# Patient Record
Sex: Male | Born: 1980 | Race: White | Hispanic: No | Marital: Married | Smoking: Former smoker
Health system: Southern US, Community
[De-identification: ages and names within clinical notes are randomized; demographics above are authoritative.]

---
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
## 2002-03-24 ENCOUNTER — Emergency Department (HOSPITAL_COMMUNITY): Admission: EM | Admit: 2002-03-24 | Discharge: 2002-03-24 | Payer: Self-pay | Admitting: Internal Medicine

## 2019-08-27 DIAGNOSIS — H10013 Acute follicular conjunctivitis, bilateral: Secondary | ICD-10-CM | POA: Diagnosis not present

## 2019-12-15 ENCOUNTER — Emergency Department (HOSPITAL_BASED_OUTPATIENT_CLINIC_OR_DEPARTMENT_OTHER)
Admission: EM | Admit: 2019-12-15 | Discharge: 2019-12-15 | Disposition: A | Discharge status: Home / Self Care | Payer: BC Managed Care – PPO | Source: Home / Self Care | Attending: Emergency Medicine | Admitting: Emergency Medicine

## 2019-12-15 ENCOUNTER — Emergency Department (HOSPITAL_BASED_OUTPATIENT_CLINIC_OR_DEPARTMENT_OTHER): Payer: BC Managed Care – PPO

## 2019-12-15 ENCOUNTER — Encounter (HOSPITAL_COMMUNITY): Payer: Self-pay | Admitting: Emergency Medicine

## 2019-12-15 ENCOUNTER — Encounter (HOSPITAL_BASED_OUTPATIENT_CLINIC_OR_DEPARTMENT_OTHER): Payer: Self-pay | Admitting: Emergency Medicine

## 2019-12-15 ENCOUNTER — Other Ambulatory Visit: Payer: Self-pay

## 2019-12-15 ENCOUNTER — Inpatient Hospital Stay (HOSPITAL_COMMUNITY)
Admission: EM | Admit: 2019-12-15 | Discharge: 2019-12-20 | DRG: 177 | Disposition: A | Discharge status: Home / Self Care | Payer: BC Managed Care – PPO | Attending: Internal Medicine | Admitting: Internal Medicine

## 2019-12-15 DIAGNOSIS — Z79899 Other long term (current) drug therapy: Secondary | ICD-10-CM | POA: Diagnosis not present

## 2019-12-15 DIAGNOSIS — U071 COVID-19: Secondary | ICD-10-CM

## 2019-12-15 DIAGNOSIS — R0682 Tachypnea, not elsewhere classified: Secondary | ICD-10-CM | POA: Diagnosis not present

## 2019-12-15 DIAGNOSIS — Z6832 Body mass index (BMI) 32.0-32.9, adult: Secondary | ICD-10-CM

## 2019-12-15 DIAGNOSIS — R0602 Shortness of breath: Secondary | ICD-10-CM | POA: Diagnosis not present

## 2019-12-15 DIAGNOSIS — E872 Acidosis, unspecified: Secondary | ICD-10-CM

## 2019-12-15 DIAGNOSIS — Z7982 Long term (current) use of aspirin: Secondary | ICD-10-CM | POA: Diagnosis not present

## 2019-12-15 DIAGNOSIS — E669 Obesity, unspecified: Secondary | ICD-10-CM | POA: Diagnosis not present

## 2019-12-15 DIAGNOSIS — J1282 Pneumonia due to coronavirus disease 2019: Secondary | ICD-10-CM | POA: Diagnosis not present

## 2019-12-15 DIAGNOSIS — Z8249 Family history of ischemic heart disease and other diseases of the circulatory system: Secondary | ICD-10-CM | POA: Diagnosis not present

## 2019-12-15 DIAGNOSIS — J9601 Acute respiratory failure with hypoxia: Secondary | ICD-10-CM | POA: Diagnosis not present

## 2019-12-15 DIAGNOSIS — Z83438 Family history of other disorder of lipoprotein metabolism and other lipidemia: Secondary | ICD-10-CM | POA: Diagnosis not present

## 2019-12-15 DIAGNOSIS — Z87891 Personal history of nicotine dependence: Secondary | ICD-10-CM | POA: Diagnosis not present

## 2019-12-15 DIAGNOSIS — R7401 Elevation of levels of liver transaminase levels: Secondary | ICD-10-CM

## 2019-12-15 DIAGNOSIS — J189 Pneumonia, unspecified organism: Secondary | ICD-10-CM | POA: Diagnosis not present

## 2019-12-15 LAB — FIBRINOGEN: Fibrinogen: 500 mg/dL — ABNORMAL HIGH (ref 210–475)

## 2019-12-15 LAB — COMPREHENSIVE METABOLIC PANEL
ALT: 63 U/L — ABNORMAL HIGH (ref 0–44)
ALT: 68 U/L — ABNORMAL HIGH (ref 0–44)
AST: 52 U/L — ABNORMAL HIGH (ref 15–41)
AST: 55 U/L — ABNORMAL HIGH (ref 15–41)
Albumin: 3.6 g/dL (ref 3.5–5.0)
Albumin: 3.7 g/dL (ref 3.5–5.0)
Alkaline Phosphatase: 56 U/L (ref 38–126)
Alkaline Phosphatase: 60 U/L (ref 38–126)
Anion gap: 10 (ref 5–15)
Anion gap: 11 (ref 5–15)
BUN: 10 mg/dL (ref 6–20)
BUN: 7 mg/dL (ref 6–20)
CO2: 23 mmol/L (ref 22–32)
CO2: 20 mmol/L — ABNORMAL LOW (ref 22–32)
Calcium: 8.2 mg/dL — ABNORMAL LOW (ref 8.9–10.3)
Calcium: 8.6 mg/dL — ABNORMAL LOW (ref 8.9–10.3)
Chloride: 103 mmol/L (ref 98–111)
Chloride: 105 mmol/L (ref 98–111)
Creatinine, Ser: 1.04 mg/dL (ref 0.61–1.24)
Creatinine, Ser: 1.11 mg/dL (ref 0.61–1.24)
GFR calc Af Amer: >60 mL/min (ref >60)
GFR calc Af Amer: >60 mL/min (ref >60)
GFR calc non Af Amer: >60 mL/min (ref >60)
GFR calc non Af Amer: >60 mL/min (ref >60)
Glucose, Bld: 150 mg/dL — ABNORMAL HIGH (ref 70–99)
Glucose, Bld: 135 mg/dL — ABNORMAL HIGH (ref 70–99)
Potassium: 3.8 mmol/L (ref 3.5–5.1)
Potassium: 4.2 mmol/L (ref 3.5–5.1)
Sodium: 136 mmol/L (ref 135–145)
Sodium: 136 mmol/L (ref 135–145)
Total Bilirubin: 0.7 mg/dL (ref 0.3–1.2)
Total Bilirubin: 1.1 mg/dL (ref 0.3–1.2)
Total Protein: 6.3 g/dL — ABNORMAL LOW (ref 6.5–8.1)
Total Protein: 6.8 g/dL (ref 6.5–8.1)

## 2019-12-15 LAB — CBC WITH DIFFERENTIAL/PLATELET
Abs Immature Granulocytes: 0 10*3/uL (ref 0.00–0.07)
Basophils Absolute: 0 10*3/uL (ref 0.0–0.1)
Basophils Relative: 0 %
Eosinophils Absolute: 0 10*3/uL (ref 0.0–0.5)
Eosinophils Relative: 0 %
HCT: 43.6 % (ref 39.0–52.0)
Hemoglobin: 15.2 g/dL (ref 13.0–17.0)
Immature Granulocytes: 0 %
Lymphocytes Relative: 29 %
Lymphs Abs: 0.8 10*3/uL (ref 0.7–4.0)
MCH: 31.7 pg (ref 26.0–34.0)
MCHC: 34.9 g/dL (ref 30.0–36.0)
MCV: 91 fL (ref 80.0–100.0)
Monocytes Absolute: 0.2 10*3/uL (ref 0.1–1.0)
Monocytes Relative: 9 %
Neutro Abs: 1.6 10*3/uL — ABNORMAL LOW (ref 1.7–7.7)
Neutrophils Relative %: 62 %
Platelets: 141 10*3/uL — ABNORMAL LOW (ref 150–400)
RBC: 4.79 MIL/uL (ref 4.22–5.81)
RDW: 12.4 % (ref 11.5–15.5)
WBC: 2.6 10*3/uL — ABNORMAL LOW (ref 4.0–10.5)
nRBC: 0 % (ref 0.0–0.2)

## 2019-12-15 LAB — URINALYSIS, ROUTINE W REFLEX MICROSCOPIC
Bacteria, UA: NONE SEEN
Bilirubin Urine: NEGATIVE
Glucose, UA: NEGATIVE mg/dL
Ketones, ur: NEGATIVE mg/dL
Leukocytes,Ua: NEGATIVE
Nitrite: NEGATIVE
Protein, ur: NEGATIVE mg/dL
Specific Gravity, Urine: 1.005 (ref 1.005–1.030)
pH: 6 (ref 5.0–8.0)

## 2019-12-15 LAB — PROTIME-INR
INR: 1 (ref 0.8–1.2)
Prothrombin Time: 12.8 seconds (ref 11.4–15.2)

## 2019-12-15 LAB — LACTATE DEHYDROGENASE: LDH: 338 U/L — ABNORMAL HIGH (ref 98–192)

## 2019-12-15 LAB — LACTIC ACID, PLASMA
Lactic Acid, Venous: 1.1 mmol/L (ref 0.5–1.9)
Lactic Acid, Venous: 1.3 mmol/L (ref 0.5–1.9)

## 2019-12-15 LAB — D-DIMER, QUANTITATIVE: D-Dimer, Quant: 0.4 ug/mL-FEU (ref 0.00–0.50)

## 2019-12-15 LAB — PROCALCITONIN: Procalcitonin: <0.1 ng/mL

## 2019-12-15 LAB — TRIGLYCERIDES: Triglycerides: 175 mg/dL — ABNORMAL HIGH (ref <150)

## 2019-12-15 LAB — C-REACTIVE PROTEIN: CRP: 4.7 mg/dL — ABNORMAL HIGH (ref <1.0)

## 2019-12-15 LAB — TROPONIN I (HIGH SENSITIVITY): Troponin I (High Sensitivity): 3 ng/L (ref <18)

## 2019-12-15 LAB — FERRITIN: Ferritin: 1474 ng/mL — ABNORMAL HIGH (ref 24–336)

## 2019-12-15 MED ORDER — SODIUM CHLORIDE 0.9 % IV SOLN
INTRAVENOUS | Status: DC | PRN
  Administered 2019-12-15: 250 mL via INTRAVENOUS

## 2019-12-15 MED ORDER — FAMOTIDINE IN NACL 20-0.9 MG/50ML-% IV SOLN: 20.0000 mg | Freq: Once | INTRAVENOUS | Status: DC | PRN

## 2019-12-15 MED ORDER — DIPHENHYDRAMINE HCL 50 MG/ML IJ SOLN: 50.0000 mg | Freq: Once | INTRAMUSCULAR | Status: DC | PRN

## 2019-12-15 MED ORDER — ALBUTEROL SULFATE HFA 108 (90 BASE) MCG/ACT IN AERS: 2.0000 | INHALATION_SPRAY | Freq: Once | RESPIRATORY_TRACT | Status: DC | PRN

## 2019-12-15 MED ORDER — EPINEPHRINE 0.3 MG/0.3ML IJ SOAJ: 0.3000 mg | Freq: Once | INTRAMUSCULAR | Status: DC | PRN

## 2019-12-15 MED ORDER — METHYLPREDNISOLONE SODIUM SUCC 125 MG IJ SOLR: 125.0000 mg | Freq: Once | INTRAMUSCULAR | Status: DC | PRN

## 2019-12-15 MED ORDER — PREDNISONE 20 MG PO TABS: 40.0000 mg | ORAL_TABLET | Freq: Every day | ORAL | 0 refills | Status: DC

## 2019-12-15 MED ORDER — SODIUM CHLORIDE 0.9 % IV SOLN
1200.0000 mg | Freq: Once | INTRAVENOUS | Status: AC
  Administered 2019-12-15: 1200 mg via INTRAVENOUS
  Filled 2019-12-15: qty 10

## 2019-12-15 MED ORDER — ACETAMINOPHEN 500 MG PO TABS
1000.0000 mg | ORAL_TABLET | Freq: Once | ORAL | Status: AC
  Administered 2019-12-15: 1000 mg via ORAL
  Filled 2019-12-15: qty 2

## 2019-12-15 NOTE — ED Triage Notes (Signed)
COVID+ x 1 week. C/o worsening SOB x 2-3 days.

## 2019-12-15 NOTE — ED Notes (Signed)
Pt on monitor 

## 2019-12-15 NOTE — ED Notes (Signed)
I put pt on on 2L of oxygen for comfort care as well as pt respirations were 31

## 2019-12-15 NOTE — ED Notes (Signed)
Pt reports improved s/s of sob after 2 liters nasal cannula. spo2 96% at this time. Other vss on ccm. Side rails up, call bell in reach.

## 2019-12-15 NOTE — ED Provider Notes (Signed)
[name] EMERGENCY DEPARTMENT Provider Note   CSN: [id number] Arrival date & time: 12/15/19  1902     History Chief Complaint  Patient presents with  . Shortness of Breath    Bruce Russell is a 39 y.o. male with no past medical history who presents with shortness of breath and labored breathing in the setting of Covid infection.  Has had 8 days of symptoms.  Was seen in the ED earlier this morning for similar symptoms.  He received prednisone and monoclonal antibody treatment, but was able to ambulate with SPO2 above 91% and was sent home with pulse ox for monitoring.  Patient has had SPO2 in the mid 80s with ambulating at home and has continued to have worsening labored breathing.  Respiratory rate estimated to be 35-40 at home by wife.  Return for labored breathing, was placed on 2 L nasal cannula in triage for comfort and had improvement in tachycardia from 115 to 80s.  Denies chest pain, palpitations, nausea, vomiting, leg pain or swelling.   Shortness of Breath Severity:  Moderate Onset quality:  Gradual Timing:  Constant Progression:  Worsening Relieved by:  Oxygen Worsened by:  Activity, deep breathing and coughing Ineffective treatments:  Rest Associated symptoms: cough and fever   Associated symptoms: no abdominal pain, no chest pain, no ear pain, no rash, no sore throat, no vomiting and no wheezing        History reviewed. No pertinent past medical history.  Patient Active Problem List   Diagnosis Date Noted  . Pneumonia 12/15/2019    History reviewed. No pertinent surgical history.     No family history on file.  Social History   Tobacco Use  . Smoking status: Former Games developer  . Smokeless tobacco: Never Used  Substance Use Topics  . Alcohol use: Yes  . Drug use: Never    Home Medications Prior to Admission medications   Medication Sig Start Date End Date Taking? Authorizing Provider  acetaminophen (TYLENOL) 500 MG tablet Take 1,000  mg by mouth every 6 (six) hours as needed for moderate pain.   Yes [provider]  Ascorbic Acid (VITAMIN C) 1000 MG tablet Take 1,000 mg by mouth daily.   Yes [provider]  aspirin EC 81 MG tablet Take 81 mg by mouth daily. Swallow whole.   Yes [provider]  cholecalciferol (VITAMIN D3) 25 MCG (1000 UNIT) tablet Take 1,000 Units by mouth daily.   Yes [provider]  dextromethorphan-guaiFENesin (MUCINEX DM) 30-600 MG 12hr tablet Take 1 tablet by mouth 2 (two) times daily.   Yes [provider]  predniSONE (DELTASONE) 20 MG tablet Take 2 tablets (40 mg total) by mouth daily for 5 days. 12/15/19 12/20/19 Yes BeroElmer Sow, MD    Allergies    Other  Review of Systems   Review of Systems  Constitutional: Positive for fever. Negative for chills.  HENT: Negative for ear pain and sore throat.   Eyes: Negative for pain and visual disturbance.  Respiratory: Positive for cough and shortness of breath. Negative for wheezing.   Cardiovascular: Negative for chest pain and palpitations.  Gastrointestinal: Negative for abdominal pain and vomiting.  Genitourinary: Negative for dysuria and hematuria.  Musculoskeletal: Negative for arthralgias and back pain.  Skin: Negative for color change and rash.  Neurological: Negative for seizures and syncope.  All other systems reviewed and are negative.   Physical Exam Updated Vital Signs BP 118/66 (BP Location: Right Arm)  Pulse 80   Temp 98 F (36.7 C) (Oral)   Resp 18   Ht 6\' 3"  (1.905 m)   Wt 117 kg   SpO2 96%   BMI 32.24 kg/m   Physical Exam Vitals and nursing note reviewed.  Constitutional:      Appearance: He is well-developed.  HENT:     Head: Normocephalic and atraumatic.  Eyes:     Extraocular Movements: Extraocular movements intact.     Conjunctiva/sclera: Conjunctivae normal.  Cardiovascular:     Rate and Rhythm: Normal rate and regular rhythm.     Heart sounds: No murmur  heard.   Pulmonary:     Effort: Tachypnea present.     Comments: On 2L [place], still with labored breathing and speaking in short sentences. Scattered crackles and rhonchi. Abdominal:     Palpations: Abdomen is soft.     Tenderness: There is no abdominal tenderness.  Musculoskeletal:     Cervical back: Neck supple.     Right lower leg: No tenderness. No edema.     Left lower leg: No tenderness. No edema.  Skin:    General: Skin is warm and dry.     Capillary Refill: Capillary refill takes less than 2 seconds.  Neurological:     General: No focal deficit present.     Mental Status: He is alert.     ED Results / Procedures / Treatments   Labs (all labs ordered are listed, but only abnormal results are displayed) Labs Reviewed  COMPREHENSIVE METABOLIC PANEL - Abnormal; Notable for the following components:      Result Value   CO2 20 (*)    Glucose, Bld 135 (*)    Calcium 8.6 (*)    AST 55 (*)    ALT 68 (*)    All other components within normal limits  URINALYSIS, ROUTINE W REFLEX MICROSCOPIC - Abnormal; Notable for the following components:   Color, Urine STRAW (*)    Hgb urine dipstick SMALL (*)    All other components within normal limits  CULTURE, BLOOD (ROUTINE X 2)  CULTURE, BLOOD (ROUTINE X 2)  RESPIRATORY PANEL BY RT PCR (FLU A&B, COVID)  LACTIC ACID, PLASMA  PROTIME-INR  LACTIC ACID, PLASMA  CBC WITH DIFFERENTIAL/PLATELET    EKG EKG Interpretation  Date/Time:  Thursday December 15 2019 20:01:19 EDT Ventricular Rate:  99 PR Interval:  154 QRS Duration: 84 QT Interval:  350 QTC Calculation: 449 R Axis:   16 Text Interpretation: Normal sinus rhythm Cannot rule out Anterior infarct , age undetermined Abnormal ECG When compared with ECG of EARLIER SAME DATE QT has shortened Confirmed by [id number] (Dione Booze) on 12/15/2019 11:55:32 PM   Radiology DG Chest Port 1 View  Result Date: 12/15/2019 CLINICAL DATA:  Shortness of breath, COVID-19 positive EXAM: PORTABLE  CHEST 1 VIEW COMPARISON:  None. FINDINGS: Heart size is upper limits of normal, likely accentuated by AP semi upright exam. Low lung volumes. Mild interstitial opacities bilaterally with a peripheral and basilar distribution, left worse than right. No appreciable pleural fluid collection. No pneumothorax. IMPRESSION: Mild interstitial opacities bilaterally with a peripheral and basilar distribution, left worse than right. Findings suggestive of multifocal viral pneumonia in the setting of known COVID-19 infection. Electronically Signed   By: 12/17/2019 D.O.   On: 12/15/2019 08:19    Procedures Procedures (including critical care time)  Medications Ordered in ED Medications - No data to display  ED Course  I have reviewed the triage vital  signs and the nursing notes.  Pertinent labs & imaging results that were available during my care of the patient were reviewed by me and considered in my medical decision making (see chart for details).    MDM Rules/Calculators/A&P                          Patient presents with symptomatic Covid with significant work of breathing improved by oxygen.  He was seen at the emergency department earlier today and received monoclonal antibodies, but symptoms continue to worsen.  He would benefit from admission for supplemental oxygen, which does improve his tachypnea, tachycardia, and respiratory effort.  He was admitted to hospital service for further management.  This patient was in with Dr. Dalene Seltzer.  Final Clinical Impression(s) / ED Diagnoses Final diagnoses:  Pneumonia due to COVID-19 virus  Tachypnea    Rx / DC Orders ED Discharge Orders    None       Allayne Butcher, MD 12/16/19 Burna Mortimer    Alvira Monday, MD 12/16/19 2309

## 2019-12-15 NOTE — Discharge Instructions (Addendum)
You were evaluated in the Emergency Department and after careful evaluation, we did not find any emergent condition requiring admission or further testing in the hospital.  Your exam/testing today was overall reassuring.  Symptoms seem to be due to COVID-19.  We have provided you with the monoclonal antibody therapy here in the emergency department to help prevent severe illness.  Given the concern for possibly low oxygen numbers at home while sleeping, we recommend taking the prednisone medication as directed.  Please return to the Emergency Department if you experience any worsening of your condition.  Thank you for allowing [place] to be a part of your care.

## 2019-12-15 NOTE — ED Notes (Signed)
No adverse reactions noted within 15 minutes. Will continue to monitor after infusion.

## 2019-12-15 NOTE — ED Notes (Signed)
This RN at bedside during first 15 mins of antibody infusion to monitor for reactions

## 2019-12-15 NOTE — ED Provider Notes (Signed)
[facility] Emergency Department Provider Note MRN:  [id number]  Arrival date & time: 12/15/19     Chief Complaint   Shortness of Breath (COVID+)   History of Present Illness   Bruce Russell is a 39 y.o. year-old male with no pertinent past medical history presenting to the ED with chief complaint of shortness of breath.  7 days of cough, fever, body aches, more recently over the past 24 to 48 hours worsening shortness of breath.  Explained that he was feeling somewhat better prior to this shortness of breath.  Has been diagnosed with COVID-19.  Denies headache or vision change but has been feeling lightheaded here recently.  No abdominal pain, no vomiting or diarrhea.  Symptoms constant, moderate, symptoms worse with deep breathing, worse with ambulation.  Review of Systems  A complete 10 system review of systems was obtained and all systems are negative except as noted in the HPI and PMH.   Patient's Health History   History reviewed. No pertinent past medical history.  History reviewed. No pertinent surgical history.  No family history on file.  Social History   Socioeconomic History  . Marital status: Married    Spouse name: Not on file  . Number of children: Not on file  . Years of education: Not on file  . Highest education level: Not on file  Occupational History  . Not on file  Tobacco Use  . Smoking status: Former Games developer  . Smokeless tobacco: Never Used  Substance and Sexual Activity  . Alcohol use: Yes  . Drug use: Never  . Sexual activity: Not on file  Other Topics Concern  . Not on file  Social History Narrative  . Not on file   Social Determinants of Health   Financial Resource Strain:   . Difficulty of Paying Living Expenses: Not on file  Food Insecurity:   . Worried About Programme researcher, broadcasting/film/video in the Last Year: Not on file  . Ran Out of Food in the Last Year: Not on file  Transportation Needs:   . Lack of Transportation  (Medical): Not on file  . Lack of Transportation (Non-Medical): Not on file  Physical Activity:   . Days of Exercise per Week: Not on file  . Minutes of Exercise per Session: Not on file  Stress:   . Feeling of Stress : Not on file  Social Connections:   . Frequency of Communication with Friends and Family: Not on file  . Frequency of Social Gatherings with Friends and Family: Not on file  . Attends Religious Services: Not on file  . Active Member of Clubs or Organizations: Not on file  . Attends Banker Meetings: Not on file  . Marital Status: Not on file  Intimate Partner Violence:   . Fear of Current or Ex-Partner: Not on file  . Emotionally Abused: Not on file  . Physically Abused: Not on file  . Sexually Abused: Not on file     Physical Exam   Vitals:   12/15/19 1222 12/15/19 1226  BP:    Pulse: 94 94  Resp:    Temp:    SpO2: 95%     CONSTITUTIONAL: Well-appearing, NAD NEURO:  Alert and oriented x 3, no focal deficits EYES:  eyes equal and reactive ENT/NECK:  no LAD, no JVD CARDIO: Tachycardic rate, well-perfused, normal S1 and S2 PULM: Mildly tachypneic, especially with ambulation GI/GU:  normal bowel sounds, non-distended, non-tender MSK/SPINE:  No  gross deformities, no edema SKIN:  no rash, atraumatic PSYCH:  Appropriate speech and behavior  *Additional and/or pertinent findings included in MDM below  Diagnostic and Interventional Summary    EKG Interpretation  Date/Time:  Thursday December 15 2019 07:30:27 EDT Ventricular Rate:  115 PR Interval:    QRS Duration: 84 QT Interval:  349 QTC Calculation: 483 R Axis:   41 Text Interpretation: Sinus tachycardia Borderline T wave abnormalities Borderline prolonged QT interval Baseline wander in lead(s) II aVR aVF V1 Confirmed by Kennis Carina [phone]) on 12/15/2019 9:23:10 AM      Labs Reviewed  CBC WITH DIFFERENTIAL/PLATELET - Abnormal; Notable for the following components:      Result Value     WBC 2.6 (*)    Platelets 141 (*)    Neutro Abs 1.6 (*)    All other components within normal limits  LACTATE DEHYDROGENASE - Abnormal; Notable for the following components:   LDH 338 (*)    All other components within normal limits  FERRITIN - Abnormal; Notable for the following components:   Ferritin 1,474 (*)    All other components within normal limits  TRIGLYCERIDES - Abnormal; Notable for the following components:   Triglycerides 175 (*)    All other components within normal limits  C-REACTIVE PROTEIN - Abnormal; Notable for the following components:   CRP 4.7 (*)    All other components within normal limits  COMPREHENSIVE METABOLIC PANEL - Abnormal; Notable for the following components:   Glucose, Bld 150 (*)    Calcium 8.2 (*)    Total Protein 6.3 (*)    AST 52 (*)    ALT 63 (*)    All other components within normal limits  CULTURE, BLOOD (ROUTINE X 2)  CULTURE, BLOOD (ROUTINE X 2)  LACTIC ACID, PLASMA  D-DIMER, QUANTITATIVE (NOT AT [facility])  PROCALCITONIN  FIBRINOGEN  TROPONIN I (HIGH SENSITIVITY)    DG Chest Port 1 View  Final Result      Medications  0.9 %  sodium chloride infusion (0 mLs Intravenous Stopped 12/15/19 1131)  diphenhydrAMINE (BENADRYL) injection 50 mg (has no administration in time range)  famotidine (PEPCID) IVPB 20 mg premix (has no administration in time range)  methylPREDNISolone sodium succinate (SOLU-MEDROL) 125 mg/2 mL injection 125 mg (has no administration in time range)  albuterol (VENTOLIN HFA) 108 (90 Base) MCG/ACT inhaler 2 puff (has no administration in time range)  EPINEPHrine (EPI-PEN) injection 0.3 mg (has no administration in time range)  casirivimab-imdevimab (REGEN-COV) 1,200 mg in sodium chloride 0.9 % 110 mL IVPB (0 mg Intravenous Stopped 12/15/19 1131)  acetaminophen (TYLENOL) tablet 1,000 mg (1,000 mg Oral Given 12/15/19 1130)     Procedures  /  Critical Care Procedures  ED Course and Medical Decision Making  I have  reviewed the triage vital signs, the nursing notes, and pertinent available records from the EMR.  Listed above are laboratory and imaging tests that I personally ordered, reviewed, and interpreted and then considered in my medical decision making (see below for details).  Suspect COVID-19 as the explanation of patient's progression to shortness of breath., also considering pulmonary embolism.  Awaiting labs, chest x-ray.     D-dimer is negative, tachycardia resolved, less concern for PE.  X-ray with evidence of Covid pneumonia.  Patient ambulated without desaturation below 91%.  Good candidate for monoclonal antibody therapy which patient agrees to receiving here in the emergency department.  Patient's wife monitoring his oxygen saturations at home while sleeping, did  note some saturations between 86 and 90% overnight.  Sounds like has a chronic snoring issue and so this could be due to some component of sleep apnea.  No hypoxia here, but will provide burst prednisone.  Appropriate for discharge.  Bruce Sow. Pilar Plate, MD [facility] Emergency Medicine [facility] [email or web address]  Final Clinical Impressions(s) / ED Diagnoses     ICD-10-CM   1. COVID-19  U07.1     ED Discharge Orders         Ordered    predniSONE (DELTASONE) 20 MG tablet  Daily        12/15/19 1239           Discharge Instructions Discussed with and Provided to Patient:     Discharge Instructions     You were evaluated in the Emergency Department and after careful evaluation, we did not find any emergent condition requiring admission or further testing in the hospital.  Your exam/testing today was overall reassuring.  Symptoms seem to be due to COVID-19.  We have provided you with the monoclonal antibody therapy here in the emergency department to help prevent severe illness.  Given the concern for possibly low oxygen numbers at home while sleeping, we recommend taking the prednisone medication as  directed.  Please return to the Emergency Department if you experience any worsening of your condition.  Thank you for allowing [place] to be a part of your care.        Sabas Sous, MD 12/15/19 [phone]

## 2019-12-15 NOTE — ED Notes (Signed)
ED Provider at bedside. 

## 2019-12-15 NOTE — ED Triage Notes (Signed)
Covid pt for the past 8 days getting worse with increase SOB and labored breathing.

## 2019-12-16 ENCOUNTER — Encounter (HOSPITAL_COMMUNITY): Payer: Self-pay | Admitting: Internal Medicine

## 2019-12-16 DIAGNOSIS — J1282 Pneumonia due to coronavirus disease 2019: Secondary | ICD-10-CM

## 2019-12-16 DIAGNOSIS — R7401 Elevation of levels of liver transaminase levels: Secondary | ICD-10-CM

## 2019-12-16 DIAGNOSIS — E872 Acidosis, unspecified: Secondary | ICD-10-CM

## 2019-12-16 DIAGNOSIS — U071 COVID-19: Principal | ICD-10-CM

## 2019-12-16 DIAGNOSIS — J9601 Acute respiratory failure with hypoxia: Secondary | ICD-10-CM

## 2019-12-16 LAB — LACTIC ACID, PLASMA
Lactic Acid, Venous: 1.5 mmol/L (ref 0.5–1.9)
Lactic Acid, Venous: 2.8 mmol/L (ref 0.5–1.9)

## 2019-12-16 LAB — RESPIRATORY PANEL BY RT PCR (FLU A&B, COVID)
Influenza A by PCR: NEGATIVE
Influenza B by PCR: NEGATIVE
SARS Coronavirus 2 by RT PCR: POSITIVE — AB

## 2019-12-16 LAB — HIV ANTIBODY (ROUTINE TESTING W REFLEX): HIV Screen 4th Generation wRfx: NONREACTIVE

## 2019-12-16 MED ORDER — ACETAMINOPHEN 325 MG PO TABS
650.0000 mg | ORAL_TABLET | Freq: Four times a day (QID) | ORAL | Status: DC | PRN
  Filled 2019-12-16: qty 2

## 2019-12-16 MED ORDER — SODIUM CHLORIDE 0.9 % IV BOLUS
1000.0000 mL | Freq: Once | INTRAVENOUS | Status: AC
  Administered 2019-12-16: 1000 mL via INTRAVENOUS

## 2019-12-16 MED ORDER — VITAMIN D 25 MCG (1000 UNIT) PO TABS
1000.0000 [IU] | ORAL_TABLET | Freq: Every day | ORAL | Status: DC
  Administered 2019-12-16 – 2019-12-20 (×5): 1000 [IU] via ORAL
  Filled 2019-12-16 (×5): qty 1

## 2019-12-16 MED ORDER — ZINC SULFATE 220 (50 ZN) MG PO CAPS
220.0000 mg | ORAL_CAPSULE | Freq: Every day | ORAL | Status: DC
  Administered 2019-12-16 – 2019-12-20 (×5): 220 mg via ORAL
  Filled 2019-12-16 (×5): qty 1

## 2019-12-16 MED ORDER — SODIUM CHLORIDE 0.9 % IV SOLN
100.0000 mg | Freq: Every day | INTRAVENOUS | Status: AC
  Administered 2019-12-17 – 2019-12-20 (×4): 100 mg via INTRAVENOUS
  Filled 2019-12-16 (×4): qty 20

## 2019-12-16 MED ORDER — ENOXAPARIN SODIUM 40 MG/0.4ML ~~LOC~~ SOLN
40.0000 mg | Freq: Every day | SUBCUTANEOUS | Status: DC
  Administered 2019-12-17 – 2019-12-20 (×4): 40 mg via SUBCUTANEOUS
  Filled 2019-12-16 (×5): qty 0.4

## 2019-12-16 MED ORDER — ASCORBIC ACID 500 MG PO TABS
500.0000 mg | ORAL_TABLET | Freq: Every day | ORAL | Status: DC
  Administered 2019-12-16 – 2019-12-20 (×5): 500 mg via ORAL
  Filled 2019-12-16 (×5): qty 1

## 2019-12-16 MED ORDER — GUAIFENESIN-DM 100-10 MG/5ML PO SYRP
10.0000 mL | ORAL_SOLUTION | ORAL | Status: DC | PRN
  Administered 2019-12-17: 10 mL via ORAL
  Filled 2019-12-16: qty 10

## 2019-12-16 MED ORDER — METHYLPREDNISOLONE SODIUM SUCC 125 MG IJ SOLR
60.0000 mg | Freq: Two times a day (BID) | INTRAMUSCULAR | Status: DC
  Administered 2019-12-16 – 2019-12-20 (×10): 60 mg via INTRAVENOUS
  Filled 2019-12-16 (×10): qty 2

## 2019-12-16 MED ORDER — SODIUM CHLORIDE 0.9 % IV SOLN
200.0000 mg | Freq: Once | INTRAVENOUS | Status: AC
  Administered 2019-12-16: 200 mg via INTRAVENOUS
  Filled 2019-12-16: qty 40

## 2019-12-16 MED ORDER — HYDROCOD POLST-CPM POLST ER 10-8 MG/5ML PO SUER: 5.0000 mL | Freq: Two times a day (BID) | ORAL | Status: DC | PRN

## 2019-12-16 MED ORDER — ALBUTEROL SULFATE HFA 108 (90 BASE) MCG/ACT IN AERS
2.0000 | INHALATION_SPRAY | Freq: Four times a day (QID) | RESPIRATORY_TRACT | Status: DC | PRN
  Administered 2019-12-17: 2 via RESPIRATORY_TRACT

## 2019-12-16 NOTE — ED Notes (Signed)
Wife constantly asking about what medications and doses pt is getting, wife and pt oriented multiple times about what medication he is getting and that we can only give what is scheduled and ordered by the provider, pt verbalized understanding. Meal given to the pt.

## 2019-12-16 NOTE — ED Notes (Signed)
  Test: lactic acid Critical Value: 2.8  Name of Provider Notified: rathore   Orders Received? Or Actions Taken?: none at this time.

## 2019-12-16 NOTE — H&P (Addendum)
History and Physical    Bruce Russell PRX:[id number] DOB: [birth year] DOA: 12/15/2019  PCP: Patient, No Pcp Per Patient coming from: Home  Chief Complaint: Shortness of breath, Covid positive  HPI: Bruce Russell is a 39 y.o. male with medical history significant of obesity (BMI 32.24) presenting with complaints of shortness of breath.  Patient recently tested positive for COVID-19.  He was seen in the ED yesterday for similar symptoms and received prednisone and monoclonal antibody infusion.  He was able to ambulate but oxygen saturation above 91% at that time and was sent home with pulse ox monitoring.  His oxygen saturation dropped to the mid 80s with ambulation at home and he continued to have worsening shortness of breath.  As such, patient returned to the ED.  Patient states him and several other family members tested positive for Covid about 8 days ago after a family gathering.  His symptoms include fevers, chills, body aches, fatigue, shortness of breath, poor appetite, and loss of taste/smell.  His shortness of breath has continued to worsen.  Denies nausea, vomiting, abdominal pain, or diarrhea.  ED Course: Afebrile. Placed on 2 L supplemental oxygen for tachypnea/increased work of breathing.  SARS-CoV-2 PCR test positive.  CBC done during ED visit yesterday morning showing WBC 2.6, hemoglobin 15.2, hematocrit 43.6, platelet 141K.  Repeat CBC done today pending.  Sodium 136, potassium 4.2, chloride 105, bicarb 20, BUN 7, creatinine 1.1, glucose 135, anion gap 11.  AST 55, ALT 68.  Alk phos and T bili normal.  Lactic acid 1.3 >2.8.  INR 1.0.  UA not suggestive of infection.  Blood cultures pending.  Chest x-ray showing findings concerning for multifocal pneumonia. No medications administered.  Review of Systems:  All systems reviewed and apart from history of presenting illness, are negative.  History reviewed. No pertinent past medical history.  History reviewed. No pertinent surgical  history.   reports that he has quit smoking. He has never used smokeless tobacco. He reports current alcohol use. He reports that he does not use drugs.  Allergies  Allergen Reactions  . Other     Vicks formula 44 : face swelling     Family History  Problem Relation Age of Onset  . CAD Father   . Hypertension Father   . Hyperlipidemia Father     Prior to Admission medications   Medication Sig Start Date End Date Taking? Authorizing Provider  acetaminophen (TYLENOL) 500 MG tablet Take 1,000 mg by mouth every 6 (six) hours as needed for moderate pain.   Yes [provider]  Ascorbic Acid (VITAMIN C) 1000 MG tablet Take 1,000 mg by mouth daily.   Yes [provider]  aspirin EC 81 MG tablet Take 81 mg by mouth daily. Swallow whole.   Yes [provider]  cholecalciferol (VITAMIN D3) 25 MCG (1000 UNIT) tablet Take 1,000 Units by mouth daily.   Yes [provider]  dextromethorphan-guaiFENesin (MUCINEX DM) 30-600 MG 12hr tablet Take 1 tablet by mouth 2 (two) times daily.   Yes [provider]  predniSONE (DELTASONE) 20 MG tablet Take 2 tablets (40 mg total) by mouth daily for 5 days. 12/15/19 12/20/19 Yes Maudie Flakes, MD    Physical Exam: Vitals:   12/16/19 0100 12/16/19 0115 12/16/19 0145 12/16/19 0200  BP: 130/80 118/75 119/72 133/82  Pulse: 86 75 70 82  Resp: 20 (!) 24 20 (!) 29  Temp:      TempSrc:  SpO2: 98% 98% 97% 98%  Weight:      Height:        Physical Exam Constitutional:      General: He is not in acute distress.    Appearance: He is ill-appearing.  HENT:     Head: Normocephalic and atraumatic.  Eyes:     Extraocular Movements: Extraocular movements intact.     Conjunctiva/sclera: Conjunctivae normal.  Cardiovascular:     Rate and Rhythm: Normal rate and regular rhythm.     Pulses: Normal pulses.  Pulmonary:     Effort: Pulmonary effort is normal.     Breath sounds: Rales present. No wheezing.      Comments: Satting well on 2 L supplemental oxygen Abdominal:     General: Bowel sounds are normal. There is no distension.     Palpations: Abdomen is soft.     Tenderness: There is no abdominal tenderness.  Musculoskeletal:        General: No swelling or tenderness.     Cervical back: Normal range of motion and neck supple.  Skin:    General: Skin is warm and dry.  Neurological:     General: No focal deficit present.     Mental Status: He is alert and oriented to person, place, and time.     Labs on Admission: I have personally reviewed following labs and imaging studies  CBC: Recent Labs  Lab 12/15/19 0749  WBC 2.6*  NEUTROABS 1.6*  HGB 15.2  HCT 43.6  MCV 91.0  PLT 366*   Basic Metabolic Panel: Recent Labs  Lab 12/15/19 0749 12/15/19 2100  NA 136 136  K 3.8 4.2  CL 103 105  CO2 23 20*  GLUCOSE 150* 135*  BUN 10 7  CREATININE 1.04 1.11  CALCIUM 8.2* 8.6*   GFR: Estimated Creatinine Clearance: 123.2 mL/min (by C-G formula based on SCr of 1.11 mg/dL). Liver Function Tests: Recent Labs  Lab 12/15/19 0749 12/15/19 2100  AST 52* 55*  ALT 63* 68*  ALKPHOS 56 60  BILITOT 0.7 1.1  PROT 6.3* 6.8  ALBUMIN 3.6 3.7   No results for input(s): LIPASE, AMYLASE in the last 168 hours. No results for input(s): AMMONIA in the last 168 hours. Coagulation Profile: Recent Labs  Lab 12/15/19 2100  INR 1.0   Cardiac Enzymes: No results for input(s): CKTOTAL, CKMB, CKMBINDEX, TROPONINI in the last 168 hours. BNP (last 3 results) No results for input(s): PROBNP in the last 8760 hours. HbA1C: No results for input(s): HGBA1C in the last 72 hours. CBG: No results for input(s): GLUCAP in the last 168 hours. Lipid Profile: Recent Labs    12/15/19 0749  TRIG 175*   Thyroid Function Tests: No results for input(s): TSH, T4TOTAL, FREET4, T3FREE, THYROIDAB in the last 72 hours. Anemia Panel: Recent Labs    12/15/19 0751  FERRITIN 1,474*   Urine analysis:     Component Value Date/Time   COLORURINE STRAW (A) 12/15/2019 2011   APPEARANCEUR CLEAR 12/15/2019 2011   LABSPEC 1.005 12/15/2019 2011   PHURINE 6.0 12/15/2019 2011   GLUCOSEU NEGATIVE 12/15/2019 2011   HGBUR SMALL (A) 12/15/2019 2011   [place] NEGATIVE 12/15/2019 2011   [place] 12/15/2019 2011   PROTEINUR NEGATIVE 12/15/2019 2011   NITRITE NEGATIVE 12/15/2019 2011   LEUKOCYTESUR NEGATIVE 12/15/2019 2011    Radiological Exams on Admission: DG Chest Port 1 View  Result Date: 12/15/2019 CLINICAL DATA:  Shortness of breath, COVID-19 positive EXAM: PORTABLE CHEST 1 VIEW COMPARISON:  None. FINDINGS: Heart size is upper limits of normal, likely accentuated by AP semi upright exam. Low lung volumes. Mild interstitial opacities bilaterally with a peripheral and basilar distribution, left worse than right. No appreciable pleural fluid collection. No pneumothorax. IMPRESSION: Mild interstitial opacities bilaterally with a peripheral and basilar distribution, left worse than right. Findings suggestive of multifocal viral pneumonia in the setting of known COVID-19 infection. Electronically Signed   By: Davina Poke D.O.   On: 12/15/2019 08:19    EKG: Independently reviewed.  Sinus tachycardia, no prior tracing for comparison.  Assessment/Plan Principal Problem:   Pneumonia due to COVID-19 virus Active Problems:   Acute hypoxemic respiratory failure (HCC)   Metabolic acidosis   Elevated transaminase level   Lactic acidosis   Acute hypoxemic respiratory failure secondary to COVID-19 viral multifocal pneumonia: SARS-CoV-2 PCR test positive.  Chest x-ray showing findings concerning for multifocal pneumonia.  D-dimer normal and CRP 4.7.  Hypoxic to the 80s with ambulation at home.  Currently satting well on 2 L supplemental oxygen. -Remdesivir -IV Solu-Medrol 0.5 mg/kg every 12 hours -Received monoclonal antibody infusion on 9/30 -Vitamin C, zinc, vitamin D -Antitussives as  needed -Tylenol as needed -Bronchodilator as needed -Daily CBC with differential, CMP, CRP, D-dimer -Airborne and contact precautions -Incentive spirometry, flutter valve -Encourage prone positioning -Continuous pulse ox -Supplemental oxygen as needed to keep oxygen saturation above 90% -Blood culture x2 pending  Mild lactic acidosis: Suspect due to dehydration in the setting of poor oral intake.  No tachycardia, tachypnea, fever, or other signs of sepsis at this time.  Procalcitonin <0.10. -IV fluid bolus, repeat lactic acid level.  Mild normal anion gap metabolic acidosis: Likely related to mild lactic acidosis. -IV fluid hydration, continue to monitor metabolic panel  Mild elevation of transaminases: Likely related to COVID-19 viral infection. -Continue to monitor LFTs  HIV screening The patient falls between the ages of 13-64 and should be screened for HIV, therefore HIV testing ordered.  DVT prophylaxis: Lovenox Code Status: Full code Family Communication: No family available at this time. Disposition Plan: Status is: Inpatient  Remains inpatient appropriate because:IV treatments appropriate due to intensity of illness or inability to take PO, Inpatient level of care appropriate due to severity of illness and New supplemental oxygen requirement   Dispo: The patient is from: Home              Anticipated d/c is to: Home              Anticipated d/c date is: 3 days              Patient currently is not medically stable to d/c.  The medical decision making on this patient was of high complexity and the patient is at high risk for clinical deterioration, therefore this is a level 3 visit.  Shela Leff MD [facility]  If 7PM-7AM, please contact night-coverage [email or web address]  12/16/2019, 2:12 AM

## 2019-12-16 NOTE — ED Notes (Signed)
bfast ordered 

## 2019-12-16 NOTE — Progress Notes (Signed)
PROGRESS NOTE  Bruce Russell XTG:[id number] DOB: 1981/02/12 DOA: 12/15/2019 PCP: Patient, No Pcp Per   LOS: 1 day   Brief Narrative / Interim history: 39 year old male with history of obesity, came into the hospital with shortness of breath.  He recently tested positive for COVID-19, came initially to the ED and received prednisone and a monoclonal antibody infusion.  He went home then continued to feel short of breath, oxygen levels were in the 80s with ambulation and came back to the hospital.  Chest x-ray on admission shows multifocal pneumonia.  He was placed on remdesivir and steroids and admitted to the hospital  Subjective / 24h Interval events: He is doing well this morning, his breathing is good at rest, gets weak and fatigued with walking.  Assessment & Plan:  Principal Problem Acute Hypoxic Respiratory Failure due to Covid-19 Viral Illness /multifocal pneumonia -Minimal oxygen needs this morning, on 2 L -He was placed on remdesivir, continue -Continue steroids -Continue to monitor inflammatory markers -Lactic acidosis resolved   COVID-19 Labs  Recent Labs    12/15/19 0749 12/15/19 0751  DDIMER 0.40  --   FERRITIN  --  1,474*  LDH 338*  --   CRP  --  4.7*    Lab Results  Component Value Date   SARSCOV2NAA POSITIVE (A) 12/15/2019    Active Problems Elevated LFTs-in the setting of Covid infection, stable today  Scheduled Meds: . vitamin C  500 mg Oral Daily  . cholecalciferol  1,000 Units Oral Daily  . enoxaparin (LOVENOX) injection  40 mg Subcutaneous Q0600  . methylPREDNISolone (SOLU-MEDROL) injection  60 mg Intravenous Q12H  . zinc sulfate  220 mg Oral Daily   Continuous Infusions: . [START ON 12/17/2019] remdesivir 100 mg in NS 100 mL     PRN Meds:.acetaminophen, albuterol, chlorpheniramine-HYDROcodone, guaiFENesin-dextromethorphan  DVT prophylaxis: Lovenox Code Status: Full code Family Communication: wife over the phone  Status is:  Inpatient  Remains inpatient appropriate because:Inpatient level of care appropriate due to severity of illness  Dispo: The patient is from: Home              Anticipated d/c is to: Home              Anticipated d/c date is: 2 days              Patient currently is not medically stable to d/c.  Consultants:  None   Procedures:  None   Microbiology: None   Antibacterials: None    Objective: Vitals:   12/16/19 0600 12/16/19 0700 12/16/19 0900 12/16/19 1000  BP: 121/74 123/76 121/85 (!) 146/86  Pulse: 70 69 72 86  Resp: 16 15 13 19   Temp:      TempSrc:      SpO2: 98% 96% 97% 94%  Weight:      Height:        Intake/Output Summary (Last 24 hours) at 12/16/2019 1025 Last data filed at 12/16/2019 0532 Gross per 24 hour  Intake 2000 ml  Output --  Net 2000 ml   Filed Weights   12/15/19 2009  Weight: 117 kg    Examination: Constitutional: NAD Eyes: no scleral icterus ENMT: Mucous membranes are moist.  Neck: normal, supple Respiratory: Faint bibasilar rhonchi, no wheezing, no crackles Cardiovascular: Regular rate and rhythm, no murmurs / rubs / gallops. Abdomen: non distended, no tenderness. Bowel sounds positive.  Musculoskeletal: no clubbing / cyanosis.  Skin: no rashes Neurologic: CN 2-12 grossly intact. Strength 5/5 in  all 4.   Data Reviewed: I have independently reviewed following labs and imaging studies   CBC: Recent Labs  Lab 12/15/19 0749  WBC 2.6*  NEUTROABS 1.6*  HGB 15.2  HCT 43.6  MCV 91.0  PLT 141*   Basic Metabolic Panel: Recent Labs  Lab 12/15/19 0749 12/15/19 2100  NA 136 136  K 3.8 4.2  CL 103 105  CO2 23 20*  GLUCOSE 150* 135*  BUN 10 7  CREATININE 1.04 1.11  CALCIUM 8.2* 8.6*   GFR: Estimated Creatinine Clearance: 123.2 mL/min (by C-G formula based on SCr of 1.11 mg/dL). Liver Function Tests: Recent Labs  Lab 12/15/19 0749 12/15/19 2100  AST 52* 55*  ALT 63* 68*  ALKPHOS 56 60  BILITOT 0.7 1.1  PROT 6.3* 6.8   ALBUMIN 3.6 3.7   No results for input(s): LIPASE, AMYLASE in the last 168 hours. No results for input(s): AMMONIA in the last 168 hours. Coagulation Profile: Recent Labs  Lab 12/15/19 2100  INR 1.0   Cardiac Enzymes: No results for input(s): CKTOTAL, CKMB, CKMBINDEX, TROPONINI in the last 168 hours. BNP (last 3 results) No results for input(s): PROBNP in the last 8760 hours. HbA1C: No results for input(s): HGBA1C in the last 72 hours. CBG: No results for input(s): GLUCAP in the last 168 hours. Lipid Profile: Recent Labs    12/15/19 0749  TRIG 175*   Thyroid Function Tests: No results for input(s): TSH, T4TOTAL, FREET4, T3FREE, THYROIDAB in the last 72 hours. Anemia Panel: Recent Labs    12/15/19 0751  FERRITIN 1,474*   Urine analysis:    Component Value Date/Time   COLORURINE STRAW (A) 12/15/2019 2011   APPEARANCEUR CLEAR 12/15/2019 2011   LABSPEC 1.005 12/15/2019 2011   PHURINE 6.0 12/15/2019 2011   GLUCOSEU NEGATIVE 12/15/2019 2011   HGBUR SMALL (A) 12/15/2019 2011   BILIRUBINUR NEGATIVE 12/15/2019 2011   KETONESUR NEGATIVE 12/15/2019 2011   PROTEINUR NEGATIVE 12/15/2019 2011   NITRITE NEGATIVE 12/15/2019 2011   LEUKOCYTESUR NEGATIVE 12/15/2019 2011   Sepsis Labs: Invalid input(s): PROCALCITONIN, LACTICIDVEN  Recent Results (from the past 240 hour(s))  Blood Culture (routine x 2)     Status: None (Preliminary result)   Collection Time: 12/15/19  7:45 AM   Specimen: BLOOD  Result Value Ref Range Status   Specimen Description   Final    BLOOD LEFT ANTECUBITAL Performed at [facility], [address], [place], [place] [address]    Special Requests   Final    BOTTLES DRAWN AEROBIC AND ANAEROBIC Blood Culture adequate volume Performed at [facility], [address] [place], [place] [address]    Culture   Final    NO GROWTH < 24 HOURS Performed at [facility], [address] [place], [place] [address]    Report Status  PENDING  Incomplete  Blood Culture (routine x 2)     Status: None (Preliminary result)   Collection Time: 12/15/19  7:54 AM   Specimen: BLOOD  Result Value Ref Range Status   Specimen Description   Final    BLOOD RIGHT ANTECUBITAL Performed at [facility], [address], [place], [place] [address]    Special Requests   Final    BOTTLES DRAWN AEROBIC AND ANAEROBIC Blood Culture adequate volume Performed at [facility], [address] [place], [place] [address]    Culture   Final    NO GROWTH < 24 HOURS  Performed at [facility], [address] [place], [place] [address]    Report Status PENDING  Incomplete  Culture, blood (Routine x 2)     Status: None (Preliminary result)   Collection Time: 12/15/19  8:38 PM   Specimen: BLOOD  Result Value Ref Range Status   Specimen Description BLOOD RIGHT ARM  Final   Special Requests   Final    BOTTLES DRAWN AEROBIC AND ANAEROBIC Blood Culture adequate volume   Culture   Final    NO GROWTH < 12 HOURS Performed at [facility], [address] [place], [place] [address]    Report Status PENDING  Incomplete  Culture, blood (Routine x 2)     Status: None (Preliminary result)   Collection Time: 12/15/19  8:44 PM   Specimen: BLOOD LEFT HAND  Result Value Ref Range Status   Specimen Description BLOOD LEFT HAND  Final   Special Requests   Final    BOTTLES DRAWN AEROBIC ONLY Blood Culture results may not be optimal due to an excessive volume of blood received in culture bottles   Culture   Final    NO GROWTH < 12 HOURS Performed at [facility], [address] [place], [place] [address]    Report Status PENDING  Incomplete  Respiratory Panel by RT PCR (Flu A&B, Covid) - Nasopharyngeal Swab     Status: Abnormal   Collection Time: 12/15/19 10:29 PM   Specimen: Nasopharyngeal Swab  Result Value Ref Range Status   SARS Coronavirus 2 by RT PCR POSITIVE (A) NEGATIVE Final    Comment: RESULT CALLED TO, READ  BACK BY AND VERIFIED WITH: B BECK RN 12/16/19 AT 0018 SK (NOTE) SARS-CoV-2 target nucleic acids are DETECTED.  SARS-CoV-2 RNA is generally detectable in upper respiratory specimens  during the acute phase of infection. Positive results are indicative of the presence of the identified virus, but do not rule out bacterial infection or co-infection with other pathogens not detected by the test. Clinical correlation with patient history and other diagnostic information is necessary to determine patient infection status. The expected result is Negative.  Fact Sheet for Patients:  https://www.moore.com/https://www.fda.gov/media/142436/download  Fact Sheet for Healthcare Providers: https://www.young.biz/https://www.fda.gov/media/142435/download  This test is not yet approved or cleared by the [place] FDA and  has been authorized for detection and/or diagnosis of SARS-CoV-2 by FDA under an Emergency Use Authorization (EUA).  This EUA will remain in effect (meaning this test can be used)  for the duration of  the COVID-19 declaration under Section 564(b)(1) of the Act, 21 U.S.C. section 360bbb-3(b)(1), unless the authorization is terminated or revoked sooner.      Influenza A by PCR NEGATIVE NEGATIVE Final   Influenza B by PCR NEGATIVE NEGATIVE Final    Comment: (NOTE) The Xpert Xpress SARS-CoV-2/FLU/RSV assay is intended as an aid in  the diagnosis of influenza from Nasopharyngeal swab specimens and  should not be used as a sole basis for treatment. Nasal washings and  aspirates are unacceptable for Xpert Xpress SARS-CoV-2/FLU/RSV  testing.  Fact Sheet for Patients: https://www.moore.com/https://www.fda.gov/media/142436/download  Fact Sheet for Healthcare Providers: https://www.young.biz/https://www.fda.gov/media/142435/download  This test is not yet approved or cleared by the [place] FDA and  has been authorized for detection and/or diagnosis of SARS-CoV-2 by  FDA under an Emergency Use Authorization (EUA). This EUA will remain  in effect (meaning  this test can be used) for the duration of the  Covid-19 declaration under Section 564(b)(1) of the  Act, 21  U.S.C. section 360bbb-3(b)(1), unless the authorization is  terminated or revoked. Performed at [facility], [address] [place], [place] [address]       Radiology Studies: DG Chest Port 1 View  Result Date: 12/15/2019 CLINICAL DATA:  Shortness of breath, COVID-19 positive EXAM: PORTABLE CHEST 1 VIEW COMPARISON:  None. FINDINGS: Heart size is upper limits of normal, likely accentuated by AP semi upright exam. Low lung volumes. Mild interstitial opacities bilaterally with a peripheral and basilar distribution, left worse than right. No appreciable pleural fluid collection. No pneumothorax. IMPRESSION: Mild interstitial opacities bilaterally with a peripheral and basilar distribution, left worse than right. Findings suggestive of multifocal viral pneumonia in the setting of known COVID-19 infection. Electronically Signed   By: Duanne Guess D.O.   On: 12/15/2019 08:19    Pamella Pert, MD, PhD [facility]  Between 7 am - 7 pm I am available, please contact me via Amion or Securechat  Between 7 pm - 7 am I am not available, please contact night coverage MD/APP via Amion

## 2019-12-17 DIAGNOSIS — E872 Acidosis: Secondary | ICD-10-CM

## 2019-12-17 DIAGNOSIS — J9601 Acute respiratory failure with hypoxia: Secondary | ICD-10-CM

## 2019-12-17 DIAGNOSIS — R7401 Elevation of levels of liver transaminase levels: Secondary | ICD-10-CM

## 2019-12-17 LAB — CBC WITH DIFFERENTIAL/PLATELET
Abs Immature Granulocytes: 0.04 10*3/uL (ref 0.00–0.07)
Basophils Absolute: 0 10*3/uL (ref 0.0–0.1)
Basophils Relative: 0 %
Eosinophils Absolute: 0 10*3/uL (ref 0.0–0.5)
Eosinophils Relative: 0 %
HCT: 42.5 % (ref 39.0–52.0)
Hemoglobin: 14.3 g/dL (ref 13.0–17.0)
Immature Granulocytes: 1 %
Lymphocytes Relative: 9 %
Lymphs Abs: 0.6 10*3/uL — ABNORMAL LOW (ref 0.7–4.0)
MCH: 31.3 pg (ref 26.0–34.0)
MCHC: 33.6 g/dL (ref 30.0–36.0)
MCV: 93 fL (ref 80.0–100.0)
Monocytes Absolute: 0.4 10*3/uL (ref 0.1–1.0)
Monocytes Relative: 6 %
Neutro Abs: 5.4 10*3/uL (ref 1.7–7.7)
Neutrophils Relative %: 84 %
Platelets: 217 10*3/uL (ref 150–400)
RBC: 4.57 MIL/uL (ref 4.22–5.81)
RDW: 13 % (ref 11.5–15.5)
WBC: 6.4 10*3/uL (ref 4.0–10.5)
nRBC: 0 % (ref 0.0–0.2)

## 2019-12-17 LAB — COMPREHENSIVE METABOLIC PANEL
ALT: 58 U/L — ABNORMAL HIGH (ref 0–44)
AST: 34 U/L (ref 15–41)
Albumin: 3.3 g/dL — ABNORMAL LOW (ref 3.5–5.0)
Alkaline Phosphatase: 53 U/L (ref 38–126)
Anion gap: 11 (ref 5–15)
BUN: 12 mg/dL (ref 6–20)
CO2: 23 mmol/L (ref 22–32)
Calcium: 8.5 mg/dL — ABNORMAL LOW (ref 8.9–10.3)
Chloride: 105 mmol/L (ref 98–111)
Creatinine, Ser: 0.97 mg/dL (ref 0.61–1.24)
GFR calc Af Amer: >60 mL/min (ref >60)
GFR calc non Af Amer: >60 mL/min (ref >60)
Glucose, Bld: 144 mg/dL — ABNORMAL HIGH (ref 70–99)
Potassium: 3.9 mmol/L (ref 3.5–5.1)
Sodium: 139 mmol/L (ref 135–145)
Total Bilirubin: 0.6 mg/dL (ref 0.3–1.2)
Total Protein: 6.5 g/dL (ref 6.5–8.1)

## 2019-12-17 LAB — C-REACTIVE PROTEIN: CRP: 4.2 mg/dL — ABNORMAL HIGH (ref <1.0)

## 2019-12-17 LAB — D-DIMER, QUANTITATIVE: D-Dimer, Quant: 0.45 ug/mL-FEU (ref 0.00–0.50)

## 2019-12-17 NOTE — ED Notes (Signed)
Filled pts water pitcher and emptied urinal. Pt denies further needs.

## 2019-12-17 NOTE — ED Notes (Signed)
+  tele Breakfast Ordered 

## 2019-12-17 NOTE — Progress Notes (Signed)
PROGRESS NOTE  Bruce Russell XTA:[id number] DOB: Sep 13, 1980 DOA: 12/15/2019 PCP: Patient, No Pcp Per   LOS: 2 days   Brief Narrative / Interim history: 39 year old male with history of obesity, came into the hospital with shortness of breath.  He recently tested positive for COVID-19, came initially to the ED and received prednisone and a monoclonal antibody infusion.  He went home then continued to feel short of breath, oxygen levels were in the 80s with ambulation and came back to the hospital.  Chest x-ray on admission shows multifocal pneumonia.  He was placed on remdesivir and steroids and admitted to the hospital  Subjective / 24h Interval events: Feeling better overall, still on oxygen this morning with 2 L.  Complains of coughing spells and reports wheezing with coughing spells and feeling like "he is breathing through a straw".  Assessment & Plan:  Principal Problem Acute Hypoxic Respiratory Failure due to Covid-19 Viral Illness /multifocal pneumonia -Remains on 2 L this morning, attempt to wean off to room air.  Encouraged albuterol given reported wheezing, apparently has had some asthma as a kid and used an inhaler back then  -He was placed on remdesivir, continue -Continue steroids -Continue to monitor inflammatory markers, improving -Lactic acidosis resolved   COVID-19 Labs  Recent Labs    12/15/19 0749 12/15/19 0751 12/17/19 0432  DDIMER 0.40  --  0.45  FERRITIN  --  1,474*  --   LDH 338*  --   --   CRP  --  4.7* 4.2*    Lab Results  Component Value Date   SARSCOV2NAA POSITIVE (A) 12/15/2019    Active Problems Elevated LFTs-in the setting of Covid infection, improving today  Scheduled Meds: . vitamin C  500 mg Oral Daily  . cholecalciferol  1,000 Units Oral Daily  . enoxaparin (LOVENOX) injection  40 mg Subcutaneous Q0600  . methylPREDNISolone (SOLU-MEDROL) injection  60 mg Intravenous Q12H  . zinc sulfate  220 mg Oral Daily   Continuous Infusions: .  remdesivir 100 mg in NS 100 mL     PRN Meds:.acetaminophen, albuterol, chlorpheniramine-HYDROcodone, guaiFENesin-dextromethorphan  DVT prophylaxis: Lovenox Code Status: Full code Family Communication: wife over the phone  Status is: Inpatient  Remains inpatient appropriate because:Inpatient level of care appropriate due to severity of illness  Dispo: The patient is from: Home              Anticipated d/c is to: Home              Anticipated d/c date is: 1 day              Patient currently is not medically stable to d/c.  Consultants:  None   Procedures:  None   Microbiology: None   Antibacterials: None    Objective: Vitals:   12/17/19 0230 12/17/19 0245 12/17/19 0550 12/17/19 0600  BP:   122/82 118/68  Pulse: 81 76 81 69  Resp: (!) 26 (!) 25 (!) 21 (!) 22  Temp:      TempSrc:      SpO2: 98% 96% 93% 91%  Weight:      Height:        Intake/Output Summary (Last 24 hours) at 12/17/2019 1049 Last data filed at 12/16/2019 1839 Gross per 24 hour  Intake --  Output 2400 ml  Net -2400 ml   Filed Weights   12/15/19 2009  Weight: 117 kg    Examination: Constitutional: No distress, in bed Eyes: No scleral icterus ENMT:  Moist mucous membranes Neck: normal, supple Respiratory: Faint bibasilar rhonchi, no wheezing, no crackles Cardiovascular: Regular rate and rhythm, no murmurs Abdomen: Soft, nontender, nondistended, bowel sounds positive Musculoskeletal: no clubbing / cyanosis.  Skin: No rashes seen Neurologic: Nonfocal, equal strength  Data Reviewed: I have independently reviewed following labs and imaging studies   CBC: Recent Labs  Lab 12/15/19 0749 12/17/19 0432  WBC 2.6* 6.4  NEUTROABS 1.6* 5.4  HGB 15.2 14.3  HCT 43.6 42.5  MCV 91.0 93.0  PLT 141* 217   Basic Metabolic Panel: Recent Labs  Lab 12/15/19 0749 12/15/19 2100 12/17/19 0432  NA 136 136 139  K 3.8 4.2 3.9  CL 103 105 105  CO2 23 20* 23  GLUCOSE 150* 135* 144*  BUN 10 7 12    CREATININE 1.04 1.11 0.97  CALCIUM 8.2* 8.6* 8.5*   GFR: Estimated Creatinine Clearance: 141 mL/min (by C-G formula based on SCr of 0.97 mg/dL). Liver Function Tests: Recent Labs  Lab 12/15/19 0749 12/15/19 2100 12/17/19 0432  AST 52* 55* 34  ALT 63* 68* 58*  ALKPHOS 56 60 53  BILITOT 0.7 1.1 0.6  PROT 6.3* 6.8 6.5  ALBUMIN 3.6 3.7 3.3*   No results for input(s): LIPASE, AMYLASE in the last 168 hours. No results for input(s): AMMONIA in the last 168 hours. Coagulation Profile: Recent Labs  Lab 12/15/19 2100  INR 1.0   Cardiac Enzymes: No results for input(s): CKTOTAL, CKMB, CKMBINDEX, TROPONINI in the last 168 hours. BNP (last 3 results) No results for input(s): PROBNP in the last 8760 hours. HbA1C: No results for input(s): HGBA1C in the last 72 hours. CBG: No results for input(s): GLUCAP in the last 168 hours. Lipid Profile: Recent Labs    12/15/19 0749  TRIG 175*   Thyroid Function Tests: No results for input(s): TSH, T4TOTAL, FREET4, T3FREE, THYROIDAB in the last 72 hours. Anemia Panel: Recent Labs    12/15/19 0751  FERRITIN 1,474*   Urine analysis:    Component Value Date/Time   COLORURINE STRAW (A) 12/15/2019 2011   APPEARANCEUR CLEAR 12/15/2019 2011   LABSPEC 1.005 12/15/2019 2011   PHURINE 6.0 12/15/2019 2011   GLUCOSEU NEGATIVE 12/15/2019 2011   HGBUR SMALL (A) 12/15/2019 2011   BILIRUBINUR NEGATIVE 12/15/2019 2011   KETONESUR NEGATIVE 12/15/2019 2011   PROTEINUR NEGATIVE 12/15/2019 2011   NITRITE NEGATIVE 12/15/2019 2011   LEUKOCYTESUR NEGATIVE 12/15/2019 2011   Sepsis Labs: Invalid input(s): PROCALCITONIN, LACTICIDVEN  Recent Results (from the past 240 hour(s))  Blood Culture (routine x 2)     Status: None (Preliminary result)   Collection Time: 12/15/19  7:45 AM   Specimen: BLOOD  Result Value Ref Range Status   Specimen Description   Final    BLOOD LEFT ANTECUBITAL Performed at [facility], [address] [place],  [place] [address]    Special Requests   Final    BOTTLES DRAWN AEROBIC AND ANAEROBIC Blood Culture adequate volume Performed at [facility] [place], [address] [place], [place] [address]    Culture   Final    NO GROWTH 2 DAYS Performed at [facility], [address] [place], [place] [address]    Report Status PENDING  Incomplete  Blood Culture (routine x 2)     Status: None (Preliminary result)   Collection Time: 12/15/19  7:54 AM   Specimen: BLOOD  Result Value Ref Range Status   Specimen Description   Final    BLOOD  RIGHT ANTECUBITAL Performed at [facility], [address] [place], [place] [address]    Special Requests   Final    BOTTLES DRAWN AEROBIC AND ANAEROBIC Blood Culture adequate volume Performed at [facility], [address] [place], [place] [address]    Culture   Final    NO GROWTH 2 DAYS Performed at [facility], [address] [place], [place] [address]    Report Status PENDING  Incomplete  Culture, blood (Routine x 2)     Status: None (Preliminary result)   Collection Time: 12/15/19  8:38 PM   Specimen: BLOOD  Result Value Ref Range Status   Specimen Description BLOOD RIGHT ARM  Final   Special Requests   Final    BOTTLES DRAWN AEROBIC AND ANAEROBIC Blood Culture adequate volume   Culture   Final    NO GROWTH 2 DAYS Performed at [facility], [address] [place], [place] [address]    Report Status PENDING  Incomplete  Culture, blood (Routine x 2)     Status: None (Preliminary result)   Collection Time: 12/15/19  8:44 PM   Specimen: BLOOD LEFT HAND  Result Value Ref Range Status   Specimen Description BLOOD LEFT HAND  Final   Special Requests   Final    BOTTLES DRAWN AEROBIC ONLY Blood Culture results may not be optimal due to an excessive volume of blood received in culture bottles   Culture   Final    NO GROWTH 2 DAYS Performed at [facility], [address] [place], [place] [address]    Report  Status PENDING  Incomplete  Respiratory Panel by RT PCR (Flu A&B, Covid) - Nasopharyngeal Swab     Status: Abnormal   Collection Time: 12/15/19 10:29 PM   Specimen: Nasopharyngeal Swab  Result Value Ref Range Status   SARS Coronavirus 2 by RT PCR POSITIVE (A) NEGATIVE Final    Comment: RESULT CALLED TO, READ BACK BY AND VERIFIED WITH: B BECK RN 12/16/19 AT 0018 SK (NOTE) SARS-CoV-2 target nucleic acids are DETECTED.  SARS-CoV-2 RNA is generally detectable in upper respiratory specimens  during the acute phase of infection. Positive results are indicative of the presence of the identified virus, but do not rule out bacterial infection or co-infection with other pathogens not detected by the test. Clinical correlation with patient history and other diagnostic information is necessary to determine patient infection status. The expected result is Negative.  Fact Sheet for Patients:  https://www.moore.com/  Fact Sheet for Healthcare Providers: https://www.young.biz/  This test is not yet approved or cleared by the [place] FDA and  has been authorized for detection and/or diagnosis of SARS-CoV-2 by FDA under an Emergency Use Authorization (EUA).  This EUA will remain in effect (meaning this test can be used)  for the duration of  the COVID-19 declaration under Section 564(b)(1) of the Act, 21 U.S.C. section 360bbb-3(b)(1), unless the authorization is terminated or revoked sooner.      Influenza A by PCR NEGATIVE NEGATIVE Final   Influenza B by PCR NEGATIVE NEGATIVE Final    Comment: (NOTE) The Xpert Xpress SARS-CoV-2/FLU/RSV assay is intended as an aid in  the diagnosis of influenza from Nasopharyngeal swab specimens and  should not be used as a sole basis for treatment. Nasal washings and  aspirates are unacceptable for Xpert Xpress SARS-CoV-2/FLU/RSV  testing.  Fact Sheet for  Patients: https://www.moore.com/  Fact Sheet for Healthcare Providers:  https://www.young.biz/  This test is not yet approved or cleared by the [place] and  has been authorized for detection and/or diagnosis of SARS-CoV-2 by  FDA under an Emergency Use Authorization (EUA). This EUA will remain  in effect (meaning this test can be used) for the duration of the  Covid-19 declaration under Section 564(b)(1) of the Act, 21  U.S.C. section 360bbb-3(b)(1), unless the authorization is  terminated or revoked. Performed at [facility], [address], [place], [place] [address]       Radiology Studies: No results found.  Pamella Pert, MD, PhD [facility]  Between 7 am - 7 pm I am available, please contact me via Amion or Securechat  Between 7 pm - 7 am I am not available, please contact night coverage MD/APP via Amion

## 2019-12-17 NOTE — Plan of Care (Signed)

## 2019-12-17 NOTE — Progress Notes (Signed)
Bruce Russell [id number]  Code Status: FULL  Bruce Russell is a 39 y.o. male patient admitted from ED awake, alert - oriented X 4 - no acute distress noted. VSS - Blood pressure 139/82, pulse 79, temperature 98.2 F (36.8 C), temperature source Oral, resp. rate 17, height 6\' 3"  (1.905 m), weight 117 kg, SpO2 93 %. On 2L O2. C/o shortness of breath with exertion, no c/o chest pain. Orientation to room, and floor completed with information packet given to patient/family. Call light within reach, patient able to voice, and demonstrate understanding. Skin, clean-dry- intact without evidence of bruising, or skin tears.  No evidence of skin break down noted on exam.  ?  Will cont to eval and treat per MD orders.  , RN

## 2019-12-18 LAB — CBC WITH DIFFERENTIAL/PLATELET
Abs Immature Granulocytes: 0.04 10*3/uL (ref 0.00–0.07)
Basophils Absolute: 0 10*3/uL (ref 0.0–0.1)
Basophils Relative: 0 %
Eosinophils Absolute: 0 10*3/uL (ref 0.0–0.5)
Eosinophils Relative: 0 %
HCT: 40.5 % (ref 39.0–52.0)
Hemoglobin: 14 g/dL (ref 13.0–17.0)
Immature Granulocytes: 1 %
Lymphocytes Relative: 8 %
Lymphs Abs: 0.6 10*3/uL — ABNORMAL LOW (ref 0.7–4.0)
MCH: 32.3 pg (ref 26.0–34.0)
MCHC: 34.6 g/dL (ref 30.0–36.0)
MCV: 93.3 fL (ref 80.0–100.0)
Monocytes Absolute: 0.4 10*3/uL (ref 0.1–1.0)
Monocytes Relative: 6 %
Neutro Abs: 5.7 10*3/uL (ref 1.7–7.7)
Neutrophils Relative %: 85 %
Platelets: 250 10*3/uL (ref 150–400)
RBC: 4.34 MIL/uL (ref 4.22–5.81)
RDW: 12.7 % (ref 11.5–15.5)
WBC: 6.7 10*3/uL (ref 4.0–10.5)
nRBC: 0 % (ref 0.0–0.2)

## 2019-12-18 LAB — COMPREHENSIVE METABOLIC PANEL WITH GFR
ALT: 125 U/L — ABNORMAL HIGH (ref 0–44)
AST: 83 U/L — ABNORMAL HIGH (ref 15–41)
Albumin: 3 g/dL — ABNORMAL LOW (ref 3.5–5.0)
Alkaline Phosphatase: 49 U/L (ref 38–126)
Anion gap: 10 (ref 5–15)
BUN: 14 mg/dL (ref 6–20)
CO2: 25 mmol/L (ref 22–32)
Calcium: 8.3 mg/dL — ABNORMAL LOW (ref 8.9–10.3)
Chloride: 104 mmol/L (ref 98–111)
Creatinine, Ser: 0.97 mg/dL (ref 0.61–1.24)
GFR calc Af Amer: >60 mL/min
GFR calc non Af Amer: >60 mL/min
Glucose, Bld: 140 mg/dL — ABNORMAL HIGH (ref 70–99)
Potassium: 4.1 mmol/L (ref 3.5–5.1)
Sodium: 139 mmol/L (ref 135–145)
Total Bilirubin: 1.1 mg/dL (ref 0.3–1.2)
Total Protein: 5.6 g/dL — ABNORMAL LOW (ref 6.5–8.1)

## 2019-12-18 LAB — C-REACTIVE PROTEIN: CRP: 1.4 mg/dL — ABNORMAL HIGH

## 2019-12-18 LAB — D-DIMER, QUANTITATIVE: D-Dimer, Quant: 0.37 ug/mL-FEU (ref 0.00–0.50)

## 2019-12-18 MED ORDER — NICOTINE 14 MG/24HR TD PT24
14.0000 mg | MEDICATED_PATCH | Freq: Every day | TRANSDERMAL | Status: DC
  Administered 2019-12-18 – 2019-12-20 (×3): 14 mg via TRANSDERMAL
  Filled 2019-12-18 (×2): qty 1

## 2019-12-18 NOTE — Progress Notes (Signed)
PROGRESS NOTE  Bruce Russell JAS:[id number] DOB: 1980/04/26 DOA: 12/15/2019 PCP: Patient, No Pcp Per   LOS: 3 days   Brief Narrative / Interim history: 39 year old male with history of obesity, came into the hospital with shortness of breath.  He recently tested positive for COVID-19, came initially to the ED and received prednisone and a monoclonal antibody infusion.  He went home then continued to feel short of breath, oxygen levels were in the 80s with ambulation and came back to the hospital.  Chest x-ray on admission shows multifocal pneumonia.  He was placed on remdesivir and steroids and admitted to the hospital  Subjective / 24h Interval events: Tells me he woke up this morning his oxygen was off and his oxygen saturation was 85%, he panicked a little bit.  Did not feel short of breath and overall feels well except for when he is up and walking  Assessment & Plan:  Principal Problem Acute Hypoxic Respiratory Failure due to Covid-19 Viral Illness /multifocal pneumonia -On 3 L this morning, he appears comfortable.  Continue remdesivir, steroids, monitor inflammatory markers.  COVID-19 Labs  Recent Labs    12/17/19 0432 12/18/19 0403  DDIMER 0.45 0.37  CRP 4.2* 1.4*    Lab Results  Component Value Date   SARSCOV2NAA POSITIVE (A) 12/15/2019    Active Problems Elevated LFTs-in the setting of Covid infection, slightly up today possibly due to remdesivir  Scheduled Meds: . vitamin C  500 mg Oral Daily  . cholecalciferol  1,000 Units Oral Daily  . enoxaparin (LOVENOX) injection  40 mg Subcutaneous Q0600  . methylPREDNISolone (SOLU-MEDROL) injection  60 mg Intravenous Q12H  . zinc sulfate  220 mg Oral Daily   Continuous Infusions: . remdesivir 100 mg in NS 100 mL Stopped (12/18/19 0901)   PRN Meds:.acetaminophen, albuterol, chlorpheniramine-HYDROcodone, guaiFENesin-dextromethorphan  DVT prophylaxis: Lovenox Code Status: Full code Family Communication: Wife on phone  speaker while I was in the room  Status is: Inpatient  Remains inpatient appropriate because:Inpatient level of care appropriate due to severity of illness  Dispo: The patient is from: Home              Anticipated d/c is to: Home              Anticipated d/c date is: 2 days              Patient currently is not medically stable to d/c.  Consultants:  None   Procedures:  None   Microbiology: None   Antibacterials: None    Objective: Vitals:   12/18/19 0417 12/18/19 0744 12/18/19 0812 12/18/19 1137  BP: 116/72 125/79  133/83  Pulse: 65 72  89  Resp: 19 15  20   Temp: 97.7 F (36.5 C) 97.7 F (36.5 C)  97.7 F (36.5 C)  TempSrc: Oral Oral  Oral  SpO2: 91% 92% 92% 91%  Weight:      Height:        Intake/Output Summary (Last 24 hours) at 12/18/2019 1344 Last data filed at 12/18/2019 1100 Gross per 24 hour  Intake 400 ml  Output 1425 ml  Net -1025 ml   Filed Weights   12/15/19 2009  Weight: 117 kg    Examination: Constitutional: NAD, in bed, about to eat breakfast Eyes: No icterus seen ENMT: mmm Neck: normal, supple Respiratory: Bibasilar rhonchi, no wheezing, no crackles Cardiovascular: Regular rate and rhythm, no murmurs appreciated.  No peripheral edema Abdomen: Soft, nontender, nondistended, bowel sounds positive Musculoskeletal: no  clubbing / cyanosis.  Skin: No rashes appreciated Neurologic: Nonfocal, equal strength  Data Reviewed: I have independently reviewed following labs and imaging studies   CBC: Recent Labs  Lab 12/15/19 0749 12/17/19 0432 12/18/19 0403  WBC 2.6* 6.4 6.7  NEUTROABS 1.6* 5.4 5.7  HGB 15.2 14.3 14.0  HCT 43.6 42.5 40.5  MCV 91.0 93.0 93.3  PLT 141* 217 250   Basic Metabolic Panel: Recent Labs  Lab 12/15/19 0749 12/15/19 2100 12/17/19 0432 12/18/19 0403  NA 136 136 139 139  K 3.8 4.2 3.9 4.1  CL 103 105 105 104  CO2 23 20* 23 25  GLUCOSE 150* 135* 144* 140*  BUN 10 7 12 14   CREATININE 1.04 1.11 0.97 0.97    CALCIUM 8.2* 8.6* 8.5* 8.3*   GFR: Estimated Creatinine Clearance: 141 mL/min (by C-G formula based on SCr of 0.97 mg/dL). Liver Function Tests: Recent Labs  Lab 12/15/19 0749 12/15/19 2100 12/17/19 0432 12/18/19 0403  AST 52* 55* 34 83*  ALT 63* 68* 58* 125*  ALKPHOS 56 60 53 49  BILITOT 0.7 1.1 0.6 1.1  PROT 6.3* 6.8 6.5 5.6*  ALBUMIN 3.6 3.7 3.3* 3.0*   No results for input(s): LIPASE, AMYLASE in the last 168 hours. No results for input(s): AMMONIA in the last 168 hours. Coagulation Profile: Recent Labs  Lab 12/15/19 2100  INR 1.0   Cardiac Enzymes: No results for input(s): CKTOTAL, CKMB, CKMBINDEX, TROPONINI in the last 168 hours. BNP (last 3 results) No results for input(s): PROBNP in the last 8760 hours. HbA1C: No results for input(s): HGBA1C in the last 72 hours. CBG: No results for input(s): GLUCAP in the last 168 hours. Lipid Profile: No results for input(s): CHOL, HDL, LDLCALC, TRIG, CHOLHDL, LDLDIRECT in the last 72 hours. Thyroid Function Tests: No results for input(s): TSH, T4TOTAL, FREET4, T3FREE, THYROIDAB in the last 72 hours. Anemia Panel: No results for input(s): VITAMINB12, FOLATE, FERRITIN, TIBC, IRON, RETICCTPCT in the last 72 hours. Urine analysis:    Component Value Date/Time   COLORURINE STRAW (A) 12/15/2019 2011   APPEARANCEUR CLEAR 12/15/2019 2011   LABSPEC 1.005 12/15/2019 2011   PHURINE 6.0 12/15/2019 2011   GLUCOSEU NEGATIVE 12/15/2019 2011   HGBUR SMALL (A) 12/15/2019 2011   BILIRUBINUR NEGATIVE 12/15/2019 2011   KETONESUR NEGATIVE 12/15/2019 2011   PROTEINUR NEGATIVE 12/15/2019 2011   NITRITE NEGATIVE 12/15/2019 2011   LEUKOCYTESUR NEGATIVE 12/15/2019 2011   Sepsis Labs: Invalid input(s): PROCALCITONIN, LACTICIDVEN  Recent Results (from the past 240 hour(s))  Blood Culture (routine x 2)     Status: None (Preliminary result)   Collection Time: 12/15/19  7:45 AM   Specimen: BLOOD  Result Value Ref Range Status   Specimen  Description   Final    BLOOD LEFT ANTECUBITAL Performed at [facility], [address] [place], [place] [address]    Special Requests   Final    BOTTLES DRAWN AEROBIC AND ANAEROBIC Blood Culture adequate volume Performed at [facility] [place], [address] [place], [place] [address]    Culture   Final    NO GROWTH 3 DAYS Performed at [facility], [address] [place], [place] [address]    Report Status PENDING  Incomplete  Blood Culture (routine x 2)     Status: None (Preliminary result)   Collection Time: 12/15/19  7:54 AM   Specimen: BLOOD  Result Value Ref Range Status   Specimen Description   Final  BLOOD RIGHT ANTECUBITAL Performed at [facility], [address] [place], [place] [address]    Special Requests   Final    BOTTLES DRAWN AEROBIC AND ANAEROBIC Blood Culture adequate volume Performed at [facility], [address] [place], [place] [address]    Culture   Final    NO GROWTH 3 DAYS Performed at [facility], [address], [place], [place] [address]    Report Status PENDING  Incomplete  Culture, blood (Routine x 2)     Status: None (Preliminary result)   Collection Time: 12/15/19  8:38 PM   Specimen: BLOOD  Result Value Ref Range Status   Specimen Description BLOOD RIGHT ARM  Final   Special Requests   Final    BOTTLES DRAWN AEROBIC AND ANAEROBIC Blood Culture adequate volume   Culture   Final    NO GROWTH 3 DAYS Performed at [facility], [address], [place], [place] [address]    Report Status PENDING  Incomplete  Culture, blood (Routine x 2)     Status: None (Preliminary result)   Collection Time: 12/15/19  8:44 PM   Specimen: BLOOD LEFT HAND  Result Value Ref Range Status   Specimen Description BLOOD LEFT HAND  Final   Special Requests   Final    BOTTLES DRAWN AEROBIC ONLY Blood Culture results may not be optimal due to an excessive volume of blood received in culture bottles   Culture    Final    NO GROWTH 3 DAYS Performed at [facility], [address], [place], [place] [address]    Report Status PENDING  Incomplete  Respiratory Panel by RT PCR (Flu A&B, Covid) - Nasopharyngeal Swab     Status: Abnormal   Collection Time: 12/15/19 10:29 PM   Specimen: Nasopharyngeal Swab  Result Value Ref Range Status   SARS Coronavirus 2 by RT PCR POSITIVE (A) NEGATIVE Final    Comment: RESULT CALLED TO, READ BACK BY AND VERIFIED WITH: B BECK RN 12/16/19 AT 0018 SK (NOTE) SARS-CoV-2 target nucleic acids are DETECTED.  SARS-CoV-2 RNA is generally detectable in upper respiratory specimens  during the acute phase of infection. Positive results are indicative of the presence of the identified virus, but do not rule out bacterial infection or co-infection with other pathogens not detected by the test. Clinical correlation with patient history and other diagnostic information is necessary to determine patient infection status. The expected result is Negative.  Fact Sheet for Patients:  https://www.moore.com/  Fact Sheet for Healthcare Providers: https://www.young.biz/  This test is not yet approved or cleared by the [place] FDA and  has been authorized for detection and/or diagnosis of SARS-CoV-2 by FDA under an Emergency Use Authorization (EUA).  This EUA will remain in effect (meaning this test can be used)  for the duration of  the COVID-19 declaration under Section 564(b)(1) of the Act, 21 U.S.C. section 360bbb-3(b)(1), unless the authorization is terminated or revoked sooner.      Influenza A by PCR NEGATIVE NEGATIVE Final   Influenza B by PCR NEGATIVE NEGATIVE Final    Comment: (NOTE) The Xpert Xpress SARS-CoV-2/FLU/RSV assay is intended as an aid in  the diagnosis of influenza from Nasopharyngeal swab specimens and  should not be used as a sole basis for treatment. Nasal washings and  aspirates are unacceptable for  Xpert Xpress SARS-CoV-2/FLU/RSV  testing.  Fact Sheet for Patients: https://www.moore.com/  Fact Sheet for Healthcare  Providers: https://www.young.biz/  This test is not yet approved or cleared by the [place] and  has been authorized for detection and/or diagnosis of SARS-CoV-2 by  FDA under an Emergency Use Authorization (EUA). This EUA will remain  in effect (meaning this test can be used) for the duration of the  Covid-19 declaration under Section 564(b)(1) of the Act, 21  U.S.C. section 360bbb-3(b)(1), unless the authorization is  terminated or revoked. Performed at [facility], [address], [place], [place] [address]       Radiology Studies: No results found.  Pamella Pert, MD, PhD [facility]  Between 7 am - 7 pm I am available, please contact me via Amion or Securechat  Between 7 pm - 7 am I am not available, please contact night coverage MD/APP via Amion

## 2019-12-19 LAB — CBC WITH DIFFERENTIAL/PLATELET
Abs Immature Granulocytes: 0.08 10*3/uL — ABNORMAL HIGH (ref 0.00–0.07)
Basophils Absolute: 0 10*3/uL (ref 0.0–0.1)
Basophils Relative: 0 %
Eosinophils Absolute: 0 10*3/uL (ref 0.0–0.5)
Eosinophils Relative: 0 %
HCT: 40.4 % (ref 39.0–52.0)
Hemoglobin: 13.6 g/dL (ref 13.0–17.0)
Immature Granulocytes: 1 %
Lymphocytes Relative: 8 %
Lymphs Abs: 0.6 10*3/uL — ABNORMAL LOW (ref 0.7–4.0)
MCH: 31.5 pg (ref 26.0–34.0)
MCHC: 33.7 g/dL (ref 30.0–36.0)
MCV: 93.5 fL (ref 80.0–100.0)
Monocytes Absolute: 0.7 10*3/uL (ref 0.1–1.0)
Monocytes Relative: 8 %
Neutro Abs: 6.5 10*3/uL (ref 1.7–7.7)
Neutrophils Relative %: 83 %
Platelets: 248 10*3/uL (ref 150–400)
RBC: 4.32 MIL/uL (ref 4.22–5.81)
RDW: 12.3 % (ref 11.5–15.5)
WBC: 7.9 10*3/uL (ref 4.0–10.5)
nRBC: 0 % (ref 0.0–0.2)

## 2019-12-19 LAB — COMPREHENSIVE METABOLIC PANEL
ALT: 268 U/L — ABNORMAL HIGH (ref 0–44)
AST: 105 U/L — ABNORMAL HIGH (ref 15–41)
Albumin: 3 g/dL — ABNORMAL LOW (ref 3.5–5.0)
Alkaline Phosphatase: 50 U/L (ref 38–126)
Anion gap: 10 (ref 5–15)
BUN: 16 mg/dL (ref 6–20)
CO2: 26 mmol/L (ref 22–32)
Calcium: 8.2 mg/dL — ABNORMAL LOW (ref 8.9–10.3)
Chloride: 102 mmol/L (ref 98–111)
Creatinine, Ser: 1.07 mg/dL (ref 0.61–1.24)
GFR calc Af Amer: >60 mL/min (ref >60)
GFR calc non Af Amer: >60 mL/min (ref >60)
Glucose, Bld: 134 mg/dL — ABNORMAL HIGH (ref 70–99)
Potassium: 3.7 mmol/L (ref 3.5–5.1)
Sodium: 138 mmol/L (ref 135–145)
Total Bilirubin: 1.1 mg/dL (ref 0.3–1.2)
Total Protein: 5.6 g/dL — ABNORMAL LOW (ref 6.5–8.1)

## 2019-12-19 LAB — C-REACTIVE PROTEIN: CRP: 1.1 mg/dL — ABNORMAL HIGH (ref <1.0)

## 2019-12-19 LAB — D-DIMER, QUANTITATIVE: D-Dimer, Quant: 0.39 ug/mL-FEU (ref 0.00–0.50)

## 2019-12-19 NOTE — Care Management (Signed)
Have consult for Home health and DME.  Reached out to Dr. Elvera Lennox for PT evaluation.  Physician does not feel that a 39 year old needs PT.  Discussed reason for request.  Case manager requested consult to be cancelled. Patient needs PCP will connect him with [facility] for this.

## 2019-12-19 NOTE — Progress Notes (Signed)
PROGRESS NOTE  Bruce Russell IRW:[id number] DOB: Apr 27, 1980 DOA: 12/15/2019 PCP: Patient, No Pcp Per   LOS: 4 days   Brief Narrative / Interim history: 39 year old male with history of obesity, came into the hospital with shortness of breath.  He recently tested positive for COVID-19, came initially to the ED and received prednisone and a monoclonal antibody infusion.  He went home then continued to feel short of breath, oxygen levels were in the 80s with ambulation and came back to the hospital.  Chest x-ray on admission shows multifocal pneumonia.  He was placed on remdesivir and steroids and admitted to the hospital  Subjective / 24h Interval events: He is doing well this morning, happy that he is satting in the mid 90s on room air.  Still some shortness of breath with activity  Assessment & Plan:  Principal Problem Acute Hypoxic Respiratory Failure due to Covid-19 Viral Illness /multifocal pneumonia -Weaned off to room air this morning, will attempt to ambulate and see how he does.  One more remdesivir tomorrow, if he remains on room air I suspect he can go home afterwards  COVID-19 Labs  Recent Labs    12/17/19 0432 12/18/19 0403 12/19/19 0145  DDIMER 0.45 0.37 0.39  CRP 4.2* 1.4* 1.1*    Lab Results  Component Value Date   SARSCOV2NAA POSITIVE (A) 12/15/2019    Active Problems Elevated LFTs-in the setting of Covid infection, slightly worsening today probably due to remdesivir.  Scheduled Meds: . vitamin C  500 mg Oral Daily  . cholecalciferol  1,000 Units Oral Daily  . enoxaparin (LOVENOX) injection  40 mg Subcutaneous Q0600  . methylPREDNISolone (SOLU-MEDROL) injection  60 mg Intravenous Q12H  . nicotine  14 mg Transdermal Daily  . zinc sulfate  220 mg Oral Daily   Continuous Infusions: . remdesivir 100 mg in NS 100 mL Stopped (12/19/19 0845)   PRN Meds:.acetaminophen, albuterol, chlorpheniramine-HYDROcodone, guaiFENesin-dextromethorphan  DVT prophylaxis:  Lovenox Code Status: Full code Family Communication: Wife on phone speaker while I was in the room  Status is: Inpatient  Remains inpatient appropriate because:Inpatient level of care appropriate due to severity of illness  Dispo: The patient is from: Home              Anticipated d/c is to: Home              Anticipated d/c date is: 1 day              Patient currently is not medically stable to d/c.  Consultants:  None   Procedures:  None   Microbiology: None   Antibacterials: None    Objective: Vitals:   12/19/19 0011 12/19/19 0359 12/19/19 0757 12/19/19 0809  BP: 130/66 134/72 119/65   Pulse: (!) 58 (!) 59 64   Resp: 15 19 14    Temp: 98.2 F (36.8 C) 98.3 F (36.8 C) 97.8 F (36.6 C)   TempSrc: Oral Oral Oral   SpO2: 95% 95% 92% 95%  Weight:      Height:        Intake/Output Summary (Last 24 hours) at 12/19/2019 1045 Last data filed at 12/19/2019 0845 Gross per 24 hour  Intake 900 ml  Output 1925 ml  Net -1025 ml   Filed Weights   12/15/19 2009  Weight: 117 kg    Examination: Constitutional: No distress, in bed Eyes: No scleral icterus ENMT: Moist mucous membranes Neck: normal, supple Respiratory: Bibasilar rhonchi, no wheezing, no crackles Cardiovascular: Regular rate and  rhythm, no murmurs, no edema Abdomen: Soft, NT, ND, bowel sounds positive Musculoskeletal: no clubbing / cyanosis.  Skin: No rashes seen Neurologic: No focal deficits  Data Reviewed: I have independently reviewed following labs and imaging studies   CBC: Recent Labs  Lab 12/15/19 0749 12/17/19 0432 12/18/19 0403 12/19/19 0145  WBC 2.6* 6.4 6.7 7.9  NEUTROABS 1.6* 5.4 5.7 6.5  HGB 15.2 14.3 14.0 13.6  HCT 43.6 42.5 40.5 40.4  MCV 91.0 93.0 93.3 93.5  PLT 141* 217 250 248   Basic Metabolic Panel: Recent Labs  Lab 12/15/19 0749 12/15/19 2100 12/17/19 0432 12/18/19 0403 12/19/19 0145  NA 136 136 139 139 138  K 3.8 4.2 3.9 4.1 3.7  CL 103 105 105 104 102  CO2  23 20* 23 25 26   GLUCOSE 150* 135* 144* 140* 134*  BUN 10 7 12 14 16   CREATININE 1.04 1.11 0.97 0.97 1.07  CALCIUM 8.2* 8.6* 8.5* 8.3* 8.2*   GFR: Estimated Creatinine Clearance: 127.8 mL/min (by C-G formula based on SCr of 1.07 mg/dL). Liver Function Tests: Recent Labs  Lab 12/15/19 0749 12/15/19 2100 12/17/19 0432 12/18/19 0403 12/19/19 0145  AST 52* 55* 34 83* 105*  ALT 63* 68* 58* 125* 268*  ALKPHOS 56 60 53 49 50  BILITOT 0.7 1.1 0.6 1.1 1.1  PROT 6.3* 6.8 6.5 5.6* 5.6*  ALBUMIN 3.6 3.7 3.3* 3.0* 3.0*   No results for input(s): LIPASE, AMYLASE in the last 168 hours. No results for input(s): AMMONIA in the last 168 hours. Coagulation Profile: Recent Labs  Lab 12/15/19 2100  INR 1.0   Cardiac Enzymes: No results for input(s): CKTOTAL, CKMB, CKMBINDEX, TROPONINI in the last 168 hours. BNP (last 3 results) No results for input(s): PROBNP in the last 8760 hours. HbA1C: No results for input(s): HGBA1C in the last 72 hours. CBG: No results for input(s): GLUCAP in the last 168 hours. Lipid Profile: No results for input(s): CHOL, HDL, LDLCALC, TRIG, CHOLHDL, LDLDIRECT in the last 72 hours. Thyroid Function Tests: No results for input(s): TSH, T4TOTAL, FREET4, T3FREE, THYROIDAB in the last 72 hours. Anemia Panel: No results for input(s): VITAMINB12, FOLATE, FERRITIN, TIBC, IRON, RETICCTPCT in the last 72 hours. Urine analysis:    Component Value Date/Time   COLORURINE STRAW (A) 12/15/2019 2011   APPEARANCEUR CLEAR 12/15/2019 2011   LABSPEC 1.005 12/15/2019 2011   PHURINE 6.0 12/15/2019 2011   GLUCOSEU NEGATIVE 12/15/2019 2011   HGBUR SMALL (A) 12/15/2019 2011   BILIRUBINUR NEGATIVE 12/15/2019 2011   KETONESUR NEGATIVE 12/15/2019 2011   PROTEINUR NEGATIVE 12/15/2019 2011   NITRITE NEGATIVE 12/15/2019 2011   LEUKOCYTESUR NEGATIVE 12/15/2019 2011   Sepsis Labs: Invalid input(s): PROCALCITONIN, LACTICIDVEN  Recent Results (from the past 240 hour(s))  Blood  Culture (routine x 2)     Status: None (Preliminary result)   Collection Time: 12/15/19  7:45 AM   Specimen: BLOOD  Result Value Ref Range Status   Specimen Description   Final    BLOOD LEFT ANTECUBITAL Performed at [facility], [address], [place], [address] [place]    Special Requests   Final    BOTTLES DRAWN AEROBIC AND ANAEROBIC Blood Culture adequate volume Performed at [facility], [address] [place], [address] [place]    Culture   Final    NO GROWTH 3 DAYS Performed at [facility], [address] [place], [address] [place]    Report Status PENDING  Incomplete  Blood Culture (routine x 2)     Status: None (Preliminary result)   Collection Time: 12/15/19  7:54 AM   Specimen: BLOOD  Result Value Ref Range Status   Specimen Description   Final    BLOOD RIGHT ANTECUBITAL Performed at [facility], [address], [place], [place] [address]    Special Requests   Final    BOTTLES DRAWN AEROBIC AND ANAEROBIC Blood Culture adequate volume Performed at [facility], [address] [place], [place] [address]    Culture   Final    NO GROWTH 3 DAYS Performed at [facility], [address], [place], [place] [address]    Report Status PENDING  Incomplete  Culture, blood (Routine x 2)     Status: None (Preliminary result)   Collection Time: 12/15/19  8:38 PM   Specimen: BLOOD  Result Value Ref Range Status   Specimen Description BLOOD RIGHT ARM  Final   Special Requests   Final    BOTTLES DRAWN AEROBIC AND ANAEROBIC Blood Culture adequate volume   Culture   Final    NO GROWTH 3 DAYS Performed at [facility], [address] [place], [place] [address]    Report Status PENDING  Incomplete  Culture, blood (Routine x 2)     Status: None (Preliminary result)   Collection Time: 12/15/19  8:44 PM   Specimen: BLOOD LEFT HAND  Result Value Ref Range Status   Specimen Description BLOOD LEFT HAND  Final   Special  Requests   Final    BOTTLES DRAWN AEROBIC ONLY Blood Culture results may not be optimal due to an excessive volume of blood received in culture bottles   Culture   Final    NO GROWTH 3 DAYS Performed at [facility], [address] [place], [place] [address]    Report Status PENDING  Incomplete  Respiratory Panel by RT PCR (Flu A&B, Covid) - Nasopharyngeal Swab     Status: Abnormal   Collection Time: 12/15/19 10:29 PM   Specimen: Nasopharyngeal Swab  Result Value Ref Range Status   SARS Coronavirus 2 by RT PCR POSITIVE (A) NEGATIVE Final    Comment: RESULT CALLED TO, READ BACK BY AND VERIFIED WITH: B BECK RN 12/16/19 AT 0018 SK (NOTE) SARS-CoV-2 target nucleic acids are DETECTED.  SARS-CoV-2 RNA is generally detectable in upper respiratory specimens  during the acute phase of infection. Positive results are indicative of the presence of the identified virus, but do not rule out bacterial infection or co-infection with other pathogens not detected by the test. Clinical correlation with patient history and other diagnostic information is necessary to determine patient infection status. The expected result is Negative.  Fact Sheet for Patients:  https://www.moore.com/  Fact Sheet for Healthcare Providers: https://www.young.biz/  This test is not yet approved or cleared by the [place] FDA and  has been authorized for detection and/or diagnosis of SARS-CoV-2 by FDA under an Emergency Use Authorization (EUA).  This EUA will remain in effect (meaning this test can be used)  for the duration of  the COVID-19 declaration under Section 564(b)(1) of the Act, 21 U.S.C. section 360bbb-3(b)(1), unless the authorization is terminated or revoked sooner.      Influenza A by PCR NEGATIVE NEGATIVE Final   Influenza B by PCR NEGATIVE NEGATIVE Final    Comment: (NOTE) The Xpert Xpress SARS-CoV-2/FLU/RSV assay is intended as an aid in  the  diagnosis of  influenza from Nasopharyngeal swab specimens and  should not be used as a sole basis for treatment. Nasal washings and  aspirates are unacceptable for Xpert Xpress SARS-CoV-2/FLU/RSV  testing.  Fact Sheet for Patients: https://www.moore.com/https://www.fda.gov/media/142436/download  Fact Sheet for Healthcare Providers: https://www.young.biz/https://www.fda.gov/media/142435/download  This test is not yet approved or cleared by the [place] FDA and  has been authorized for detection and/or diagnosis of SARS-CoV-2 by  FDA under an Emergency Use Authorization (EUA). This EUA will remain  in effect (meaning this test can be used) for the duration of the  Covid-19 declaration under Section 564(b)(1) of the Act, 21  U.S.C. section 360bbb-3(b)(1), unless the authorization is  terminated or revoked. Performed at [facility], [address] [place], [place] [address]       Radiology Studies: No results found.  Pamella Pertostin Rontrell Moquin, MD, PhD [facility]  Between 7 am - 7 pm I am available, please contact me via Amion or Securechat  Between 7 pm - 7 am I am not available, please contact night coverage MD/APP via Amion

## 2019-12-20 LAB — COMPREHENSIVE METABOLIC PANEL
ALT: 193 U/L — ABNORMAL HIGH (ref 0–44)
AST: 43 U/L — ABNORMAL HIGH (ref 15–41)
Albumin: 2.9 g/dL — ABNORMAL LOW (ref 3.5–5.0)
Alkaline Phosphatase: 51 U/L (ref 38–126)
Anion gap: 10 (ref 5–15)
BUN: 18 mg/dL (ref 6–20)
CO2: 25 mmol/L (ref 22–32)
Calcium: 8.2 mg/dL — ABNORMAL LOW (ref 8.9–10.3)
Chloride: 105 mmol/L (ref 98–111)
Creatinine, Ser: 1.02 mg/dL (ref 0.61–1.24)
GFR calc Af Amer: >60 mL/min (ref >60)
GFR calc non Af Amer: >60 mL/min (ref >60)
Glucose, Bld: 141 mg/dL — ABNORMAL HIGH (ref 70–99)
Potassium: 4.2 mmol/L (ref 3.5–5.1)
Sodium: 140 mmol/L (ref 135–145)
Total Bilirubin: 0.8 mg/dL (ref 0.3–1.2)
Total Protein: 5.6 g/dL — ABNORMAL LOW (ref 6.5–8.1)

## 2019-12-20 LAB — CBC WITH DIFFERENTIAL/PLATELET
Abs Immature Granulocytes: 0.11 10*3/uL — ABNORMAL HIGH (ref 0.00–0.07)
Basophils Absolute: 0 10*3/uL (ref 0.0–0.1)
Basophils Relative: 0 %
Eosinophils Absolute: 0 10*3/uL (ref 0.0–0.5)
Eosinophils Relative: 0 %
HCT: 41 % (ref 39.0–52.0)
Hemoglobin: 13.8 g/dL (ref 13.0–17.0)
Immature Granulocytes: 2 %
Lymphocytes Relative: 10 %
Lymphs Abs: 0.7 10*3/uL (ref 0.7–4.0)
MCH: 31.2 pg (ref 26.0–34.0)
MCHC: 33.7 g/dL (ref 30.0–36.0)
MCV: 92.6 fL (ref 80.0–100.0)
Monocytes Absolute: 0.6 10*3/uL (ref 0.1–1.0)
Monocytes Relative: 9 %
Neutro Abs: 5.6 10*3/uL (ref 1.7–7.7)
Neutrophils Relative %: 79 %
Platelets: 252 10*3/uL (ref 150–400)
RBC: 4.43 MIL/uL (ref 4.22–5.81)
RDW: 12.3 % (ref 11.5–15.5)
WBC: 7.1 10*3/uL (ref 4.0–10.5)
nRBC: 0 % (ref 0.0–0.2)

## 2019-12-20 LAB — CULTURE, BLOOD (ROUTINE X 2)
Culture: NO GROWTH
Culture: NO GROWTH
Culture: NO GROWTH
Culture: NO GROWTH
Special Requests: ADEQUATE
Special Requests: ADEQUATE
Special Requests: ADEQUATE

## 2019-12-20 LAB — C-REACTIVE PROTEIN: CRP: 0.6 mg/dL (ref <1.0)

## 2019-12-20 LAB — D-DIMER, QUANTITATIVE: D-Dimer, Quant: 0.36 ug/mL-FEU (ref 0.00–0.50)

## 2019-12-20 MED ORDER — DEXAMETHASONE 6 MG PO TABS: 6.0000 mg | ORAL_TABLET | Freq: Every day | ORAL | 0 refills | Status: AC

## 2019-12-20 MED ORDER — ALBUTEROL SULFATE HFA 108 (90 BASE) MCG/ACT IN AERS: 2.0000 | INHALATION_SPRAY | Freq: Four times a day (QID) | RESPIRATORY_TRACT | 0 refills | Status: AC | PRN

## 2019-12-20 MED ORDER — GUAIFENESIN-DM 100-10 MG/5ML PO SYRP: 10.0000 mL | ORAL_SOLUTION | ORAL | 0 refills | Status: AC | PRN

## 2019-12-20 NOTE — TOC Transition Note (Signed)
Transition of Care [facility]) - CM/SW Discharge Note   Patient Details  Name: Bruce Russell MRN: [id number] Date of Birth: 12/21/1980  Transition of [facility]) CM/SW Contact:  Nance Pear, RN Phone Number: 12/20/2019, 1:19 PM   Clinical Narrative:    Case manager noted patient does not have PCP.  Case manager set up with the COVID clinic on 01/04/20 at 1100.  Pt understands needs to attend this appointment.   Final next level of care: Home/Self Care Barriers to Discharge: No Barriers Identified   Patient Goals and CMS Choice Patient states their goals for this hospitalization and ongoing recovery are:: to go home CMS Medicare.gov Compare Post Acute Care list provided to:: Patient Choice offered to / list presented to : Patient  Discharge Placement                       Discharge Plan and Services   Discharge Planning Services: CM Consult Post Acute Care Choice: Durable Medical Equipment          DME Arranged: Oxygen DME Agency: Other - Comment Loyal Buba) Date DME Agency Contacted: 12/20/19 Time DME Agency Contacted: [phone] Representative spoke with at DME Agency: Vaughan Basta            Social Determinants of Health (SDOH) Interventions     Readmission Risk Interventions No flowsheet data found.

## 2019-12-20 NOTE — TOC Transition Note (Signed)
Transition of Care [facility]) - CM/SW Discharge Note   Patient Details  Name: Bruce Russell MRN: [id number] Date of Birth: 1980-09-20  Transition of Care [facility]) CM/SW Contact:  Nance Pear, RN Phone Number: 12/20/2019, 9:43 AM   Clinical Narrative:    Case manager received request to set up home oxygen.  Case manager called patient on phone due to COVID.  Discussed options for oxygen supplier, patient had no preferences on this, CM to choose.  Case manager contacted [employer] and spoke to [place], oxygen to be delivered to room.  Patient has discharge orders to home, wife will be picking up, good support system.  No other needs identified.   Final next level of care: Home/Self Care Barriers to Discharge: No Barriers Identified   Patient Goals and CMS Choice Patient states their goals for this hospitalization and ongoing recovery are:: to go home CMS Medicare.gov Compare Post Acute Care list provided to:: Patient Choice offered to / list presented to : Patient  Discharge Placement                       Discharge Plan and Services   Discharge Planning Services: CM Consult Post Acute Care Choice: Durable Medical Equipment          DME Arranged: Oxygen DME Agency: Other - Comment Loyal Buba) Date DME Agency Contacted: 12/20/19 Time DME Agency Contacted: [phone] Representative spoke with at DME Agency: Vaughan Basta            Social Determinants of Health (SDOH) Interventions     Readmission Risk Interventions No flowsheet data found.

## 2019-12-20 NOTE — Progress Notes (Signed)
Pt discharged home.  Discharge instructions explained, pt verbalizes understanding.  Pt discharged home with oxygen tank.  Prescriptions given to pt.  All personal belongings (clothes, cell phone) returned to pt.

## 2019-12-20 NOTE — Discharge Summary (Signed)
Physician Discharge Summary  Bruce Russell ZOX:[id number] DOB: 04/04/1980 DOA: 12/15/2019  PCP: Patient, No Pcp Per  Admit date: 12/15/2019 Discharge date: 12/20/2019  Admitted From: home Disposition:  home  Recommendations for Outpatient Follow-up:  1. Follow up with PCP in 1-2 weeks  Home Health: none Equipment/Devices: 2L o2  Discharge Condition: stable CODE STATUS: Full code Diet recommendation: regular  HPI: Per admitting MD, Bruce NeverJames L Steptoe is a 39 y.o. male with medical history significant of obesity (BMI 32.24) presenting with complaints of shortness of breath.  Patient recently tested positive for COVID-19.  He was seen in the ED yesterday for similar symptoms and received prednisone and monoclonal antibody infusion.  He was able to ambulate but oxygen saturation above 91% at that time and was sent home with pulse ox monitoring.  His oxygen saturation dropped to the mid 80s with ambulation at home and he continued to have worsening shortness of breath.  As such, patient returned to the ED.  Patient states him and several other family members tested positive for Covid about 8 days ago after a family gathering.  His symptoms include fevers, chills, body aches, fatigue, shortness of breath, poor appetite, and loss of taste/smell.  His shortness of breath has continued to worsen.  Denies nausea, vomiting, abdominal pain, or diarrhea.  Hospital Course / Discharge diagnoses: Principal Problem Acute Hypoxic Respiratory Failure due to Covid-19 Viral Illness /multifocal pneumonia -patient was admitted to the hospital with COVID-19 pneumonia, he was placed on remdesivir, steroids along with supplemental oxygen.  With treatment he clinically improved, feels a lot better, able to ambulate in the hall without any significant difficulties.  Still requiring 2 L of oxygen at home with ambulation, will be arranged.  He finished Remdesivir while hospitalized and will complete 5 additional days of  Decadron for a total of 10-day course.  Active problems Obesity-based on a BMI of 32, patient will benefit from weight loss  Elevated liver enzymes-in the setting of Covid 19 infection and possibly Remdesivir, already trending down to the time of discharge.  Recommend repeat LFTs in 1 to 2 weeks to ensure normalization.   Discharge Instructions   Allergies as of 12/20/2019      Reactions   Other    Vicks formula 44 : face swelling      Medication List    STOP taking these medications   dextromethorphan-guaiFENesin 30-600 MG 12hr tablet Commonly known as: MUCINEX DM Replaced by: guaiFENesin-dextromethorphan 100-10 MG/5ML syrup   predniSONE 20 MG tablet Commonly known as: DELTASONE     TAKE these medications   acetaminophen 500 MG tablet Commonly known as: TYLENOL Take 1,000 mg by mouth every 6 (six) hours as needed for moderate pain.   albuterol 108 (90 Base) MCG/ACT inhaler Commonly known as: VENTOLIN HFA Inhale 2 puffs into the lungs every 6 (six) hours as needed for wheezing or shortness of breath.   aspirin EC 81 MG tablet Take 81 mg by mouth daily. Swallow whole.   cholecalciferol 25 MCG (1000 UNIT) tablet Commonly known as: VITAMIN D3 Take 1,000 Units by mouth daily.   dexamethasone 6 MG tablet Commonly known as: DECADRON Take 1 tablet (6 mg total) by mouth daily for 5 days.   guaiFENesin-dextromethorphan 100-10 MG/5ML syrup Commonly known as: ROBITUSSIN DM Take 10 mLs by mouth every 4 (four) hours as needed for cough. Replaces: dextromethorphan-guaiFENesin 30-600 MG 12hr tablet   vitamin C 1000 MG tablet Take 1,000 mg by mouth daily.  Durable Medical Equipment  (From admission, onward)         Start     Ordered   12/20/19 0923  For home use only DME oxygen  Once       Question Answer Comment  Length of Need 6 Months   Mode or (Route) Nasal cannula   Liters per Minute 2   Frequency Continuous (stationary and portable oxygen unit  needed)   Oxygen delivery system Gas      12/20/19 0923           Consultations:  None   Procedures/Studies:  DG Chest Port 1 View  Result Date: 12/15/2019 CLINICAL DATA:  Shortness of breath, COVID-19 positive EXAM: PORTABLE CHEST 1 VIEW COMPARISON:  None. FINDINGS: Heart size is upper limits of normal, likely accentuated by AP semi upright exam. Low lung volumes. Mild interstitial opacities bilaterally with a peripheral and basilar distribution, left worse than right. No appreciable pleural fluid collection. No pneumothorax. IMPRESSION: Mild interstitial opacities bilaterally with a peripheral and basilar distribution, left worse than right. Findings suggestive of multifocal viral pneumonia in the setting of known COVID-19 infection. Electronically Signed   By: Duanne Guess D.O.   On: 12/15/2019 08:19      Subjective: - no chest pain, shortness of breath, no abdominal pain, nausea or vomiting.   Discharge Exam: BP 116/67 (BP Location: Right Arm)   Pulse 60   Temp 97.9 F (36.6 C) (Oral)   Resp 16   Ht 6\' 3"  (1.905 m)   Wt 117 kg   SpO2 95%   BMI 32.24 kg/m   General: Pt is alert, awake, not in acute distress Cardiovascular: RRR, S1/S2 +, no rubs, no gallops Respiratory: CTA bilaterally, no wheezing, no rhonchi Abdominal: Soft, NT, ND, bowel sounds + Extremities: no edema, no cyanosis    The results of significant diagnostics from this hospitalization (including imaging, microbiology, ancillary and laboratory) are listed below for reference.     Microbiology: Recent Results (from the past 240 hour(s))  Blood Culture (routine x 2)     Status: None   Collection Time: 12/15/19  7:45 AM   Specimen: BLOOD  Result Value Ref Range Status   Specimen Description   Final    BLOOD LEFT ANTECUBITAL Performed at [facility], [address], [place], [place]    Special Requests   Final    BOTTLES DRAWN AEROBIC AND ANAEROBIC Blood Culture adequate  volume Performed at [facility], [address] [place], [place]    Culture   Final    NO GROWTH 5 DAYS Performed at [facility], [address], [place], [place]    Report Status 12/20/2019 FINAL  Final  Blood Culture (routine x 2)     Status: None   Collection Time: 12/15/19  7:54 AM   Specimen: BLOOD  Result Value Ref Range Status   Specimen Description   Final    BLOOD RIGHT ANTECUBITAL Performed at [facility], [address], [place], [place]    Special Requests   Final    BOTTLES DRAWN AEROBIC AND ANAEROBIC Blood Culture adequate volume Performed at [facility], [address] [place], [place]    Culture   Final    NO GROWTH 5 DAYS Performed at [facility], [address], [place], [place]    Report Status 12/20/2019 FINAL  Final  Culture, blood (Routine x 2)     Status: None   Collection Time: 12/15/19  8:38 PM   Specimen: BLOOD  Result Value Ref Range Status   Specimen Description BLOOD RIGHT ARM  Final   Special Requests   Final    BOTTLES DRAWN AEROBIC AND ANAEROBIC Blood Culture adequate volume   Culture   Final    NO GROWTH 5 DAYS Performed at [facility], [address], [place], [place] [address]    Report Status 12/20/2019 FINAL  Final  Culture, blood (Routine x 2)     Status: None   Collection Time: 12/15/19  8:44 PM   Specimen: BLOOD LEFT HAND  Result Value Ref Range Status   Specimen Description BLOOD LEFT HAND  Final   Special Requests   Final    BOTTLES DRAWN AEROBIC ONLY Blood Culture results may not be optimal due to an excessive volume of blood received in culture bottles   Culture   Final    NO GROWTH 5 DAYS Performed at [facility], [address], [place], [place] [address]    Report Status 12/20/2019 FINAL  Final  Respiratory Panel by RT PCR (Flu A&B, Covid) - Nasopharyngeal Swab     Status: Abnormal   Collection Time: 12/15/19  10:29 PM   Specimen: Nasopharyngeal Swab  Result Value Ref Range Status   SARS Coronavirus 2 by RT PCR POSITIVE (A) NEGATIVE Final    Comment: RESULT CALLED TO, READ BACK BY AND VERIFIED WITH: B BECK RN 12/16/19 AT 0018 SK (NOTE) SARS-CoV-2 target nucleic acids are DETECTED.  SARS-CoV-2 RNA is generally detectable in upper respiratory specimens  during the acute phase of infection. Positive results are indicative of the presence of the identified virus, but do not rule out bacterial infection or co-infection with other pathogens not detected by the test. Clinical correlation with patient history and other diagnostic information is necessary to determine patient infection status. The expected result is Negative.  Fact Sheet for Patients:  https://www.moore.com/  Fact Sheet for Healthcare Providers: https://www.young.biz/  This test is not yet approved or cleared by the [place] FDA and  has been authorized for detection and/or diagnosis of SARS-CoV-2 by FDA under an Emergency Use Authorization (EUA).  This EUA will remain in effect (meaning this test can be used)  for the duration of  the COVID-19 declaration under Section 564(b)(1) of the Act, 21 U.S.C. section 360bbb-3(b)(1), unless the authorization is terminated or revoked sooner.      Influenza A by PCR NEGATIVE NEGATIVE Final   Influenza B by PCR NEGATIVE NEGATIVE Final    Comment: (NOTE) The Xpert Xpress SARS-CoV-2/FLU/RSV assay is intended as an aid in  the diagnosis of influenza from Nasopharyngeal swab specimens and  should not be used as a sole basis for treatment. Nasal washings and  aspirates are unacceptable for Xpert Xpress SARS-CoV-2/FLU/RSV  testing.  Fact Sheet for Patients: https://www.moore.com/  Fact Sheet for Healthcare Providers: https://www.young.biz/  This test is not yet approved or cleared by the [place] FDA  and  has been authorized for detection and/or diagnosis of SARS-CoV-2 by  FDA under an Emergency Use Authorization (EUA). This EUA will remain  in effect (meaning this test can be used) for the duration of the  Covid-19 declaration under Section 564(b)(1) of the Act, 21  U.S.C. section 360bbb-3(b)(1), unless the authorization is  terminated or revoked. Performed at [facility], [address] [place], [place]  [address]      Labs: Basic Metabolic Panel: Recent Labs  Lab 12/15/19 2100 12/17/19 0432 12/18/19 0403 12/19/19 0145 12/20/19 0350  NA 136 139 139 138 140  K 4.2 3.9 4.1 3.7 4.2  CL 105 105 104 102 105  CO2 20* GLUCOSE 135* 144* 140* 134* 141*  BUN CREATININE 1.11 0.97 0.97 1.07 1.02  CALCIUM 8.6* 8.5* 8.3* 8.2* 8.2*   Liver Function Tests: Recent Labs  Lab 12/15/19 2100 12/17/19 0432 12/18/19 0403 12/19/19 0145 12/20/19 0350  AST 55* 34 83* 105* 43*  ALT 68* 58* 125* 268* 193*  ALKPHOS 60 53 49 50 51  BILITOT 1.1 0.6 1.1 1.1 0.8  PROT 6.8 6.5 5.6* 5.6* 5.6*  ALBUMIN 3.7 3.3* 3.0* 3.0* 2.9*   CBC: Recent Labs  Lab 12/15/19 0749 12/17/19 0432 12/18/19 0403 12/19/19 0145 12/20/19 0350  WBC 2.6* 6.4 6.7 7.9 7.1  NEUTROABS 1.6* 5.4 5.7 6.5 5.6  HGB 15.2 14.3 14.0 13.6 13.8  HCT 43.6 42.5 40.5 40.4 41.0  MCV 91.0 93.0 93.3 93.5 92.6  PLT 141* 217 250 248 252   CBG: No results for input(s): GLUCAP in the last 168 hours. Hgb A1c No results for input(s): HGBA1C in the last 72 hours. Lipid Profile No results for input(s): CHOL, HDL, LDLCALC, TRIG, CHOLHDL, LDLDIRECT in the last 72 hours. Thyroid function studies No results for input(s): TSH, T4TOTAL, T3FREE, THYROIDAB in the last 72 hours.  Invalid input(s): FREET3 Urinalysis    Component Value Date/Time   COLORURINE STRAW (A) 12/15/2019 2011   APPEARANCEUR CLEAR 12/15/2019 2011   LABSPEC 1.005 12/15/2019 2011   PHURINE 6.0 12/15/2019 2011   GLUCOSEU NEGATIVE  12/15/2019 2011   HGBUR SMALL (A) 12/15/2019 2011   BILIRUBINUR NEGATIVE 12/15/2019 2011   KETONESUR NEGATIVE 12/15/2019 2011   PROTEINUR NEGATIVE 12/15/2019 2011   NITRITE NEGATIVE 12/15/2019 2011   LEUKOCYTESUR NEGATIVE 12/15/2019 2011    FURTHER DISCHARGE INSTRUCTIONS:   Get Medicines reviewed and adjusted: Please take all your medications with you for your next visit with your Primary MD   Laboratory/radiological data: Please request your Primary MD to go over all hospital tests and procedure/radiological results at the follow up, please ask your Primary MD to get all Hospital records sent to his/her office.   In some cases, they will be blood work, cultures and biopsy results pending at the time of your discharge. Please request that your primary care M.D. goes through all the records of your hospital data and follows up on these results.   Also Note the following: If you experience worsening of your admission symptoms, develop shortness of breath, life threatening emergency, suicidal or homicidal thoughts you must seek medical attention immediately by calling 911 or calling your MD immediately  if symptoms less severe.   You must read complete instructions/literature along with all the possible adverse reactions/side effects for all the Medicines you take and that have been prescribed to you. Take any new Medicines after you have completely understood and accpet all the possible adverse reactions/side effects.    Do not drive when taking Pain medications or sleeping medications (Benzodaizepines)   Do not take more than prescribed Pain, Sleep and Anxiety Medications. It is not advisable to combine anxiety,sleep and pain medications without talking with your primary care practitioner   Special Instructions: If you have smoked or chewed Tobacco  in the last 2 yrs please stop smoking, stop any  regular Alcohol  and or any Recreational drug use.   Wear Seat belts while driving.     Please note: You were cared for by a hospitalist during your hospital stay. Once you are discharged, your primary care physician will handle any further medical issues. Please note that NO REFILLS for any discharge medications will be authorized once you are discharged, as it is imperative that you return to your primary care physician (or establish a relationship with a primary care physician if you do not have one) for your post hospital discharge needs so that they can reassess your need for medications and monitor your lab values.  Time coordinating discharge: 35 minutes  SIGNED:  Pamella Pert, MD, PhD 12/20/2019, 9:25 AM

## 2019-12-20 NOTE — Progress Notes (Signed)
SATURATION QUALIFICATIONS: (This note is used to comply with regulatory documentation for home oxygen)  Patient Saturations on Room Air at Rest = 93%  Patient Saturations on Room Air while Ambulating = 84%  Patient Saturations on 2 Liters of oxygen while Ambulating = 91%  Please briefly explain why patient needs home oxygen:  Pt's oxygen saturation while ambulating drops to as low as 84% on RA and comes back up to 91% on 2 L of oxygen.

## 2020-01-04 ENCOUNTER — Ambulatory Visit
Admission: RE | Admit: 2020-01-04 | Discharge: 2020-01-04 | Disposition: A | Discharge status: Home / Self Care | Payer: BC Managed Care – PPO | Source: Ambulatory Visit | Attending: Nurse Practitioner | Admitting: Nurse Practitioner

## 2020-01-04 ENCOUNTER — Other Ambulatory Visit: Payer: Self-pay

## 2020-01-04 ENCOUNTER — Ambulatory Visit (INDEPENDENT_AMBULATORY_CARE_PROVIDER_SITE_OTHER): Payer: BC Managed Care – PPO | Admitting: Nurse Practitioner

## 2020-01-04 VITALS — BP 116/72 | HR 95 | Temp 97.1°F | Ht 75.0 in | Wt 250.0 lb

## 2020-01-04 DIAGNOSIS — Z8616 Personal history of COVID-19: Secondary | ICD-10-CM

## 2020-01-04 DIAGNOSIS — J1282 Pneumonia due to coronavirus disease 2019: Secondary | ICD-10-CM | POA: Diagnosis not present

## 2020-01-04 DIAGNOSIS — U071 COVID-19: Secondary | ICD-10-CM

## 2020-01-04 DIAGNOSIS — R0602 Shortness of breath: Secondary | ICD-10-CM | POA: Diagnosis not present

## 2020-01-04 DIAGNOSIS — R059 Cough, unspecified: Secondary | ICD-10-CM | POA: Diagnosis not present

## 2020-01-04 NOTE — Patient Instructions (Addendum)
Covid 19 Cough:   Stay well hydrated  Stay active  Deep breathing exercises  May start vitamin C 2,000 mg daily, vitamin D3 2,000 IU daily, Zinc 220 mg daily, and Quercetin 500 mg twice daily  May take tylenol or fever or pain  May take mucinex DM twice daily if needed  Will order chest x ray  Will recheck labs   Follow up:  Follow up in 2 weeks or sooner if needed

## 2020-01-04 NOTE — Progress Notes (Signed)
@Patient  ID: , male    DOB: Aug 09, 1980, 39 y.o.   MRN: [id number]  Chief Complaint  Patient presents with  . Hospitalization Follow-up    Hosp: 9/30-10/5 recieved infusion, Sx: cough, gets tired going up stairs. Discharged on 2L does not use O2 95% most of the time while moving    Referring provider: No ref. provider found   39 year old male with no significant health history.  HPI  Patient presents today for post COVID care clinic visit/hospital discharge.  Patient was treated with remdesivir and oxygen.  He also received steroids.  He was admitted for acute hypoxic respiratory failure with Covid pneumonia and was also noted to have elevated liver enzymes.  Patient states that overall he has been doing well since hospital discharge.  He does still have slight cough and gets fatigued going up stairs and winded.  He has not been using his oxygen at home.  He does check his O2 sats and O2 sats have been staying above 95% on room air.  He is trying to stay active. Denies f/c/s, n/v/d, hemoptysis, PND, chest pain or edema.        Allergies  Allergen Reactions  . Other     Vicks formula 44 : face swelling      There is no immunization history on file for this patient.  History reviewed. No pertinent past medical history.  Tobacco History: Social History   Tobacco Use  Smoking Status Former Smoker  Smokeless Tobacco 24  . Types: Chew   Ready to quit: Not Answered Counseling given: Not Answered   Outpatient Encounter Medications as of 01/04/2020  Medication Sig  . acetaminophen (TYLENOL) 500 MG tablet Take 1,000 mg by mouth every 6 (six) hours as needed for moderate pain.  01/06/2020 albuterol (VENTOLIN HFA) 108 (90 Base) MCG/ACT inhaler Inhale 2 puffs into the lungs every 6 (six) hours as needed for wheezing or shortness of breath.  . Ascorbic Acid (VITAMIN C) 1000 MG tablet Take 1,000 mg by mouth daily.  Marland Kitchen aspirin EC 81 MG tablet Take 81 mg by mouth daily.  Swallow whole.  . cholecalciferol (VITAMIN D3) 25 MCG (1000 UNIT) tablet Take 1,000 Units by mouth daily.  Marland Kitchen guaiFENesin-dextromethorphan (ROBITUSSIN DM) 100-10 MG/5ML syrup Take 10 mLs by mouth every 4 (four) hours as needed for cough. (Patient not taking: Reported on 01/04/2020)   No facility-administered encounter medications on file as of 01/04/2020.     Review of Systems  Review of Systems  Constitutional: Negative.  Negative for fever.  HENT: Negative.   Respiratory: Positive for cough and shortness of breath.   Cardiovascular: Negative.  Negative for chest pain, palpitations and leg swelling.  Gastrointestinal: Negative.   Allergic/Immunologic: Negative.   Neurological: Negative.   Psychiatric/Behavioral: Negative.        Physical Exam  BP 116/72   Pulse 95   Temp (!) 97.1 F (36.2 C)   Ht 6\' 3"  (1.905 m)   Wt 250 lb 0.1 oz (113.4 kg)   SpO2 97%   BMI 31.25 kg/m   Wt Readings from Last 5 Encounters:  01/04/20 250 lb 0.1 oz (113.4 kg)  12/15/19 257 lb 15 oz (117 kg)  12/15/19 258 lb (117 kg)     Physical Exam Vitals and nursing note reviewed.  Constitutional:      General: He is not in acute distress.    Appearance: He is well-developed.  Cardiovascular:     Rate and  Rhythm: Normal rate and regular rhythm.  Pulmonary:     Effort: Pulmonary effort is normal.     Breath sounds: Normal breath sounds.  Musculoskeletal:     Right lower leg: No edema.     Left lower leg: No edema.  Skin:    General: Skin is warm and dry.  Neurological:     Mental Status: He is alert and oriented to person, place, and time.  Psychiatric:        Mood and Affect: Mood normal.        Behavior: Behavior normal.      Imaging: DG Chest Port 1 View  Result Date: 12/15/2019 CLINICAL DATA:  Shortness of breath, COVID-19 positive EXAM: PORTABLE CHEST 1 VIEW COMPARISON:  None. FINDINGS: Heart size is upper limits of normal, likely accentuated by AP semi upright exam. Low lung  volumes. Mild interstitial opacities bilaterally with a peripheral and basilar distribution, left worse than right. No appreciable pleural fluid collection. No pneumothorax. IMPRESSION: Mild interstitial opacities bilaterally with a peripheral and basilar distribution, left worse than right. Findings suggestive of multifocal viral pneumonia in the setting of known COVID-19 infection. Electronically Signed   By: Duanne Guess D.O.   On: 12/15/2019 08:19     Assessment & Plan:   Pneumonia due to COVID-19 virus Cough:   Stay well hydrated  Stay active  Deep breathing exercises  May start vitamin C 2,000 mg daily, vitamin D3 2,000 IU daily, Zinc 220 mg daily, and Quercetin 500 mg twice daily  May take tylenol or fever or pain  May take mucinex DM twice daily if needed  Will order chest x ray  Will recheck labs   Follow up:  Follow up in 2 weeks or sooner if needed      Ivonne Andrew, NP 01/05/2020

## 2020-01-05 ENCOUNTER — Other Ambulatory Visit: Payer: Self-pay | Admitting: Nurse Practitioner

## 2020-01-05 LAB — COMPREHENSIVE METABOLIC PANEL
ALT: 43 IU/L (ref 0–44)
AST: 19 IU/L (ref 0–40)
Albumin/Globulin Ratio: 1.7 (ref 1.2–2.2)
Albumin: 3.8 g/dL — ABNORMAL LOW (ref 4.0–5.0)
Alkaline Phosphatase: 87 IU/L (ref 44–121)
BUN/Creatinine Ratio: 15 (ref 9–20)
BUN: 15 mg/dL (ref 6–20)
Bilirubin Total: 0.6 mg/dL (ref 0.0–1.2)
CO2: 22 mmol/L (ref 20–29)
Calcium: 9.3 mg/dL (ref 8.7–10.2)
Chloride: 106 mmol/L (ref 96–106)
Creatinine, Ser: 1 mg/dL (ref 0.76–1.27)
GFR calc Af Amer: 109 mL/min/{1.73_m2} (ref >59)
GFR calc non Af Amer: 94 mL/min/{1.73_m2} (ref >59)
Globulin, Total: 2.3 g/dL (ref 1.5–4.5)
Glucose: 87 mg/dL (ref 65–99)
Potassium: 4.4 mmol/L (ref 3.5–5.2)
Sodium: 143 mmol/L (ref 134–144)
Total Protein: 6.1 g/dL (ref 6.0–8.5)

## 2020-01-05 LAB — CBC
Hematocrit: 42.5 % (ref 37.5–51.0)
Hemoglobin: 15 g/dL (ref 13.0–17.7)
MCH: 32.3 pg (ref 26.6–33.0)
MCHC: 35.3 g/dL (ref 31.5–35.7)
MCV: 91 fL (ref 79–97)
Platelets: 131 10*3/uL — ABNORMAL LOW (ref 150–450)
RBC: 4.65 x10E6/uL (ref 4.14–5.80)
RDW: 13.1 % (ref 11.6–15.4)
WBC: 6.2 10*3/uL (ref 3.4–10.8)

## 2020-01-05 MED ORDER — PREDNISONE 20 MG PO TABS: 20.0000 mg | ORAL_TABLET | Freq: Every day | ORAL | 0 refills | Status: AC

## 2020-01-05 MED ORDER — AZITHROMYCIN 250 MG PO TABS: ORAL_TABLET | ORAL | 0 refills | Status: AC

## 2020-01-05 NOTE — Assessment & Plan Note (Signed)
Cough:   Stay well hydrated  Stay active  Deep breathing exercises  May start vitamin C 2,000 mg daily, vitamin D3 2,000 IU daily, Zinc 220 mg daily, and Quercetin 500 mg twice daily  May take tylenol or fever or pain  May take mucinex DM twice daily if needed  Will order chest x ray  Will recheck labs   Follow up:  Follow up in 2 weeks or sooner if needed

## 2020-01-18 ENCOUNTER — Ambulatory Visit (INDEPENDENT_AMBULATORY_CARE_PROVIDER_SITE_OTHER): Payer: BC Managed Care – PPO | Admitting: Nurse Practitioner

## 2020-01-18 VITALS — BP 118/80 | HR 85 | Temp 97.7°F | Ht 75.0 in | Wt 253.0 lb

## 2020-01-18 DIAGNOSIS — U071 COVID-19: Secondary | ICD-10-CM | POA: Diagnosis not present

## 2020-01-18 DIAGNOSIS — J1282 Pneumonia due to coronavirus disease 2019: Secondary | ICD-10-CM | POA: Diagnosis not present

## 2020-01-18 NOTE — Patient Instructions (Addendum)
Pneumonia due to COVID-19 virus:   Stay well hydrated  Stay active  Deep breathing exercises  May take tylenol or fever or pain  May take mucinex DM twice daily if needed  Will order chest x ray to be done 02/01/20    Follow up:  Follow up in 2 weeks or sooner if needed

## 2020-01-18 NOTE — Progress Notes (Signed)
@Patient  ID: , male    DOB: [birth year], 39 y.o.   MRN: [id number]  Chief Complaint  Patient presents with  . Follow-up    Still fatigue, SOB    Referring provider: No ref. provider found  39 year old male with no significant health history.  HPI  Patient presents today for post COVID care clinic visit/follow-up.  He was last seen here on 01/04/2020 for a hospital follow-up.  At that time he was still complaining of cough and shortness of breath.  Imaging showed persistent bilateral opacities that were not significantly changed from previous imaging in the hospital.  Patient was prescribed azithromycin and prednisone.  He reports that he is much improved.  He is thinking about returning to work on light duty.  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  He is trying to stay active.  He has been doing deep breathing exercises.  Overall his lab work at last visit was normal. Denies f/c/s, n/v/d, hemoptysis, PND, chest pain or edema.       Allergies  Allergen Reactions  . Other     Vicks formula 44 : face swelling      There is no immunization history on file for this patient.  History reviewed. No pertinent past medical history.  Tobacco History: Social History   Tobacco Use  Smoking Status Former Smoker  Smokeless Tobacco 01/06/2020  . Types: Chew   Ready to quit: No Counseling given: Yes   Outpatient Encounter Medications as of 01/18/2020  Medication Sig  . acetaminophen (TYLENOL) 500 MG tablet Take 1,000 mg by mouth every 6 (six) hours as needed for moderate pain.  13/05/2019 albuterol (VENTOLIN HFA) 108 (90 Base) MCG/ACT inhaler Inhale 2 puffs into the lungs every 6 (six) hours as needed for wheezing or shortness of breath.  . Ascorbic Acid (VITAMIN C) 1000 MG tablet Take 1,000 mg by mouth daily.  Marland Kitchen aspirin EC 81 MG tablet Take 81 mg by mouth daily. Swallow whole.  . cholecalciferol (VITAMIN D3) 25 MCG (1000 UNIT) tablet Take  1,000 Units by mouth daily.  Marland Kitchen guaiFENesin-dextromethorphan (ROBITUSSIN DM) 100-10 MG/5ML syrup Take 10 mLs by mouth every 4 (four) hours as needed for cough. (Patient not taking: Reported on 01/04/2020)   No facility-administered encounter medications on file as of 01/18/2020.     Review of Systems  Review of Systems  Constitutional: Negative.  Negative for fatigue.  HENT: Negative.   Respiratory: Negative for cough and shortness of breath.   Cardiovascular: Negative.  Negative for chest pain, palpitations and leg swelling.  Gastrointestinal: Negative.   Allergic/Immunologic: Negative.   Neurological: Negative.   Psychiatric/Behavioral: Negative.        Physical Exam  BP 118/80 (BP Location: Right Arm)   Pulse 85   Temp 97.7 F (36.5 C)   Ht 6\' 3"  (1.905 m)   Wt 253 lb 0.1 oz (114.8 kg)   SpO2 97%   BMI 31.62 kg/m   Wt Readings from Last 5 Encounters:  01/18/20 253 lb 0.1 oz (114.8 kg)  01/04/20 250 lb 0.1 oz (113.4 kg)  12/15/19 257 lb 15 oz (117 kg)  12/15/19 258 lb (117 kg)     Physical Exam Vitals and nursing note reviewed.  Constitutional:      General: He is not in acute distress.    Appearance: He is well-developed.  Cardiovascular:     Rate and Rhythm: Normal rate and  regular rhythm.  Pulmonary:     Effort: Pulmonary effort is normal.     Breath sounds: Normal breath sounds.  Musculoskeletal:     Right lower leg: No edema.     Left lower leg: No edema.  Skin:    General: Skin is warm and dry.  Neurological:     Mental Status: He is alert and oriented to person, place, and time.  Psychiatric:        Mood and Affect: Mood normal.        Behavior: Behavior normal.       Imaging: DG Chest 2 View  Result Date: 01/05/2020 CLINICAL DATA:  History of COVID nineteen. Additional history provided: Cough, shortness of breath. EXAM: CHEST - 2 VIEW COMPARISON:  Prior chest radiographs 12/15/2019. FINDINGS: Heart size within normal limits. There are  persistent patchy bilateral pulmonary opacities with a peripheral and mid to lower lung predominance. No evidence of pleural effusion or pneumothorax. No acute bony abnormality identified. IMPRESSION: Persistent patchy bilateral pulmonary opacities with a peripheral and mid to lower lung predominance, overall not significantly changed as compared to 12/15/2019. These findings likely reflect atypical/viral pneumonia given the provided history of COVID positivity. Radiographic follow-up to resolution is recommended. Electronically Signed   By: Jackey Loge DO   On: 01/05/2020 09:41     Assessment & Plan:   Pneumonia due to COVID-19 virus Stay well hydrated  Stay active  Deep breathing exercises  May take tylenol or fever or pain  May take mucinex DM twice daily if needed  Will order chest x ray to be done 02/01/20    Follow up:  Follow up in 2 weeks or sooner if needed      Ivonne Andrew, NP 01/19/2020

## 2020-01-19 NOTE — Assessment & Plan Note (Signed)
Stay well hydrated  Stay active  Deep breathing exercises  May take tylenol or fever or pain  May take mucinex DM twice daily if needed  Will order chest x ray to be done 02/01/20    Follow up:  Follow up in 2 weeks or sooner if needed

## 2020-01-20 DIAGNOSIS — J9601 Acute respiratory failure with hypoxia: Secondary | ICD-10-CM | POA: Diagnosis not present

## 2020-01-20 DIAGNOSIS — U071 COVID-19: Secondary | ICD-10-CM | POA: Diagnosis not present

## 2020-01-31 ENCOUNTER — Ambulatory Visit
Admission: RE | Admit: 2020-01-31 | Discharge: 2020-01-31 | Disposition: A | Discharge status: Home / Self Care | Payer: BC Managed Care – PPO | Source: Ambulatory Visit | Attending: Nurse Practitioner | Admitting: Nurse Practitioner

## 2020-01-31 ENCOUNTER — Other Ambulatory Visit: Payer: Self-pay

## 2020-01-31 DIAGNOSIS — J1282 Pneumonia due to coronavirus disease 2019: Secondary | ICD-10-CM

## 2020-01-31 DIAGNOSIS — U071 COVID-19: Secondary | ICD-10-CM

## 2020-01-31 DIAGNOSIS — R0609 Other forms of dyspnea: Secondary | ICD-10-CM | POA: Diagnosis not present

## 2020-02-02 ENCOUNTER — Ambulatory Visit (INDEPENDENT_AMBULATORY_CARE_PROVIDER_SITE_OTHER): Payer: BC Managed Care – PPO | Admitting: Nurse Practitioner

## 2020-02-02 VITALS — BP 118/80 | HR 81 | Temp 97.3°F | Ht 75.0 in | Wt 252.0 lb

## 2020-02-02 DIAGNOSIS — U071 COVID-19: Secondary | ICD-10-CM

## 2020-02-02 DIAGNOSIS — J1282 Pneumonia due to coronavirus disease 2019: Secondary | ICD-10-CM | POA: Diagnosis not present

## 2020-02-02 DIAGNOSIS — G933 Postviral fatigue syndrome: Secondary | ICD-10-CM

## 2020-02-02 DIAGNOSIS — R0602 Shortness of breath: Secondary | ICD-10-CM

## 2020-02-02 DIAGNOSIS — G9331 Postviral fatigue syndrome: Secondary | ICD-10-CM | POA: Insufficient documentation

## 2020-02-02 MED ORDER — MONTELUKAST SODIUM 10 MG PO TABS: 10.0000 mg | ORAL_TABLET | Freq: Every day | ORAL | 3 refills | Status: AC

## 2020-02-02 MED ORDER — PREDNISONE 20 MG PO TABS: 20.0000 mg | ORAL_TABLET | Freq: Every day | ORAL | 0 refills | Status: AC

## 2020-02-02 NOTE — Progress Notes (Signed)
@Patient  ID: Bruce Russell, male    DOB: [birth year], 39 y.o.   MRN: [id number]  Chief Complaint  Patient presents with  . Follow-up    Needing to get results for xray done yesterday; having episodes of SOB about 1/week uses inhaler    Referring provider: No ref. provider found   39 year old male withno significant health history.  HPI  Patient presents today for post COVID care clinic visit/follow-up.  Patient was last seen in our office on 01/18/2020.  His recent chest x-ray showed improving pneumonia with minimal residual opacities at the lung bases.  Patient states that he has returned to work for 4 hours/day.  He states that he is still getting very fatigued in the afternoons.  He does not use his inhaler often but states that he does get out of breath with exertion.  He does notice himself wheezing at times.  He has been trying to stay active and do home physical therapy exercises.  Patient has also noted tremor with fine motor movement.  He is having difficulty at his job as a 13/05/2019 working with small [employer].  He also noticed that he is having difficulty signing his name.  We discussed that if this continues we will place a referral for neuro rehab and neurology consult.  Denies f/c/s, n/v/d, hemoptysis, PND, chest pain or edema.        Allergies  Allergen Reactions  . Other     Vicks formula 44 : face swelling      There is no immunization history on file for this patient.  History reviewed. No pertinent past medical history.  Tobacco History: Social History   Tobacco Use  Smoking Status Former Smoker  Smokeless Tobacco Curator  . Types: Chew   Ready to quit: No Counseling given: Yes   Outpatient Encounter Medications as of 02/02/2020  Medication Sig  . acetaminophen (TYLENOL) 500 MG tablet Take 1,000 mg by mouth every 6 (six) hours as needed for moderate pain.  02/04/2020 albuterol (VENTOLIN HFA) 108 (90 Base) MCG/ACT inhaler Inhale 2 puffs into the  lungs every 6 (six) hours as needed for wheezing or shortness of breath.  . Ascorbic Acid (VITAMIN C) 1000 MG tablet Take 1,000 mg by mouth daily.  Marland Kitchen aspirin EC 81 MG tablet Take 81 mg by mouth daily. Swallow whole.  . cholecalciferol (VITAMIN D3) 25 MCG (1000 UNIT) tablet Take 1,000 Units by mouth daily.  Marland Kitchen guaiFENesin-dextromethorphan (ROBITUSSIN DM) 100-10 MG/5ML syrup Take 10 mLs by mouth every 4 (four) hours as needed for cough. (Patient not taking: Reported on 01/04/2020)  . montelukast (SINGULAIR) 10 MG tablet Take 1 tablet (10 mg total) by mouth at bedtime.  . predniSONE (DELTASONE) 20 MG tablet Take 1 tablet (20 mg total) by mouth daily with breakfast for 5 days.   No facility-administered encounter medications on file as of 02/02/2020.     Review of Systems  Review of Systems  Constitutional: Positive for fatigue. Negative for fever.  HENT: Negative.   Respiratory: Positive for cough, shortness of breath and wheezing.   Cardiovascular: Negative.   Gastrointestinal: Negative.   Allergic/Immunologic: Negative.   Neurological: Positive for tremors.  Psychiatric/Behavioral: Negative.        Physical Exam  BP 118/80 (BP Location: Left Arm)   Pulse 81   Temp (!) 97.3 F (36.3 C)   Ht 6\' 3"  (1.905 m)   Wt 252 lb 0.1 oz (114.3 kg)   SpO2  98%   BMI 31.50 kg/m   Wt Readings from Last 5 Encounters:  02/02/20 252 lb 0.1 oz (114.3 kg)  01/18/20 253 lb 0.1 oz (114.8 kg)  01/04/20 250 lb 0.1 oz (113.4 kg)  12/15/19 257 lb 15 oz (117 kg)  12/15/19 258 lb (117 kg)     Physical Exam Vitals and nursing note reviewed.  Constitutional:      General: He is not in acute distress.    Appearance: He is well-developed.  Cardiovascular:     Rate and Rhythm: Normal rate and regular rhythm.  Pulmonary:     Effort: Pulmonary effort is normal.     Breath sounds: Normal breath sounds.  Musculoskeletal:     Right lower leg: No edema.     Left lower leg: No edema.  Skin:     General: Skin is warm and dry.  Neurological:     Mental Status: He is alert and oriented to person, place, and time.  Psychiatric:        Mood and Affect: Mood normal.        Behavior: Behavior normal.       Imaging: DG Chest 2 View  Result Date: 02/01/2020 CLINICAL DATA:  History of COVID. COVID positive 12/06/2019. Persistent dyspnea on exertion. EXAM: CHEST - 2 VIEW COMPARISON:  Radiograph 01/04/2020, 12/15/2019 FINDINGS: Improvement in heterogeneous bilateral lung opacities from prior exam. There may be minimal residual subsegmental the lung bases. No new airspace disease or consolidation. Normal heart size and mediastinal contours. No pneumothorax or pneumomediastinum. No pleural fluid. No acute osseous abnormalities are seen. IMPRESSION: Improving bilateral lung opacities from prior exam, consistent with resolving COVID pneumonia. Minimal residual subsegmental opacities at the lung bases. Electronically Signed   By: Narda Rutherford M.D.   On: 02/01/2020 12:48   DG Chest 2 View  Result Date: 01/05/2020 CLINICAL DATA:  History of COVID nineteen. Additional history provided: Cough, shortness of breath. EXAM: CHEST - 2 VIEW COMPARISON:  Prior chest radiographs 12/15/2019. FINDINGS: Heart size within normal limits. There are persistent patchy bilateral pulmonary opacities with a peripheral and mid to lower lung predominance. No evidence of pleural effusion or pneumothorax. No acute bony abnormality identified. IMPRESSION: Persistent patchy bilateral pulmonary opacities with a peripheral and mid to lower lung predominance, overall not significantly changed as compared to 12/15/2019. These findings likely reflect atypical/viral pneumonia given the provided history of COVID positivity. Radiographic follow-up to resolution is recommended. Electronically Signed   By: Jackey Loge DO   On: 01/05/2020 09:41     Assessment & Plan:   Pneumonia due to COVID-19 virus Fatigue Shortness of  breath:   Stay well hydrated  Stay active  Deep breathing exercises  May take tylenol for fever or pain  May take mucinex DM twice daily if needed   Will order prednisone  Will order Singulair  May use inhaler twice daily  Continue home PT exercises    Follow up:  Follow up in 1 month or sooner if needed      Ivonne Andrew, NP 02/02/2020

## 2020-02-02 NOTE — Assessment & Plan Note (Signed)
Fatigue Shortness of breath:   Stay well hydrated  Stay active  Deep breathing exercises  May take tylenol for fever or pain  May take mucinex DM twice daily if needed   Will order prednisone  Will order Singulair  May use inhaler twice daily  Continue home PT exercises    Follow up:  Follow up in 1 month or sooner if needed

## 2020-02-02 NOTE — Patient Instructions (Addendum)
Pneumonia due to COVID-19 virus Fatigue Shortness of breath:   Stay well hydrated  Stay active  Deep breathing exercises  May take tylenol for fever or pain  May take mucinex DM twice daily if needed   Will order prednisone  Will order Singulair  May use inhaler twice daily  Continue home PT exercises    Follow up:  Follow up in 1 month or sooner if needed

## 2020-02-14 ENCOUNTER — Other Ambulatory Visit: Payer: Self-pay | Admitting: Nurse Practitioner

## 2020-02-14 DIAGNOSIS — R251 Tremor, unspecified: Secondary | ICD-10-CM

## 2020-02-14 DIAGNOSIS — Z8616 Personal history of COVID-19: Secondary | ICD-10-CM

## 2020-02-19 DIAGNOSIS — J9601 Acute respiratory failure with hypoxia: Secondary | ICD-10-CM | POA: Diagnosis not present

## 2020-02-19 DIAGNOSIS — U071 COVID-19: Secondary | ICD-10-CM | POA: Diagnosis not present

## 2020-03-02 ENCOUNTER — Other Ambulatory Visit: Payer: Self-pay | Admitting: Nurse Practitioner

## 2020-03-02 ENCOUNTER — Other Ambulatory Visit: Payer: Self-pay

## 2020-03-02 ENCOUNTER — Ambulatory Visit
Admission: RE | Admit: 2020-03-02 | Discharge: 2020-03-02 | Disposition: A | Discharge status: Home / Self Care | Payer: BC Managed Care – PPO | Source: Ambulatory Visit | Attending: Nurse Practitioner | Admitting: Nurse Practitioner

## 2020-03-02 DIAGNOSIS — Z8616 Personal history of COVID-19: Secondary | ICD-10-CM

## 2020-03-02 DIAGNOSIS — R059 Cough, unspecified: Secondary | ICD-10-CM | POA: Diagnosis not present

## 2020-03-02 DIAGNOSIS — R0602 Shortness of breath: Secondary | ICD-10-CM | POA: Diagnosis not present

## 2020-03-05 ENCOUNTER — Ambulatory Visit (INDEPENDENT_AMBULATORY_CARE_PROVIDER_SITE_OTHER): Payer: BC Managed Care – PPO | Admitting: Nurse Practitioner

## 2020-03-05 ENCOUNTER — Other Ambulatory Visit: Payer: Self-pay

## 2020-03-05 DIAGNOSIS — U071 COVID-19: Secondary | ICD-10-CM

## 2020-03-05 DIAGNOSIS — J1282 Pneumonia due to coronavirus disease 2019: Secondary | ICD-10-CM | POA: Diagnosis not present

## 2020-03-05 NOTE — Patient Instructions (Signed)
Pneumonia due to COVID-19 virus Fatigue Shortness of breath:  Resolved.    Stay well hydrated  Stay active  Deep breathing exercises  May take tylenol for fever or pain  May use inhaler as needed      Follow up:  Follow up if needed

## 2020-03-05 NOTE — Assessment & Plan Note (Signed)
Fatigue Shortness of breath:  Resolved.    Stay well hydrated  Stay active  Deep breathing exercises  May take tylenol for fever or pain  May use inhaler as needed      Follow up:  Follow up if needed

## 2020-03-05 NOTE — Progress Notes (Signed)
@Patient  ID: , male    DOB: 1980/07/01, 39 y.o.   MRN: [id number]  Chief Complaint  Patient presents with  . Follow-up    Improved     Referring provider: No ref. provider found   39 year old male withno significant health history.    HPI  Patient presents today for post COVID care clinic visit follow-up.  He was last seen in our office on 02/02/2020.  He did have repeat chest x-ray last week which is now clear.  Patient states that he is much improved.  He has returned to work.  He states that his tremors have improved.  He is try to stay active and walk.  He states that since his last visit here he has had to use in his inhaler once.  Overall feels that he is back to normal other than still trying to get over the fatigue. Denies f/c/s, n/v/d, hemoptysis, PND, chest pain or edema.        Allergies  Allergen Reactions  . Other     Vicks formula 44 : face swelling      There is no immunization history on file for this patient.  History reviewed. No pertinent past medical history.  Tobacco History: Social History   Tobacco Use  Smoking Status Former Smoker  Smokeless Tobacco 02/04/2020  . Types: Chew   Ready to quit: No Counseling given: Yes   Outpatient Encounter Medications as of 03/05/2020  Medication Sig  . acetaminophen (TYLENOL) 500 MG tablet Take 1,000 mg by mouth every 6 (six) hours as needed for moderate pain.  03/07/2020 albuterol (VENTOLIN HFA) 108 (90 Base) MCG/ACT inhaler Inhale 2 puffs into the lungs every 6 (six) hours as needed for wheezing or shortness of breath.  . Ascorbic Acid (VITAMIN C) 1000 MG tablet Take 1,000 mg by mouth daily.  Marland Kitchen aspirin EC 81 MG tablet Take 81 mg by mouth daily. Swallow whole.  . cholecalciferol (VITAMIN D3) 25 MCG (1000 UNIT) tablet Take 1,000 Units by mouth daily.  Marland Kitchen guaiFENesin-dextromethorphan (ROBITUSSIN DM) 100-10 MG/5ML syrup Take 10 mLs by mouth every 4 (four) hours as needed for cough. (Patient not  taking: Reported on 01/04/2020)  . montelukast (SINGULAIR) 10 MG tablet Take 1 tablet (10 mg total) by mouth at bedtime.   No facility-administered encounter medications on file as of 03/05/2020.     Review of Systems  Review of Systems  Constitutional: Negative.   HENT: Negative.   Respiratory: Negative for cough and shortness of breath.   Cardiovascular: Negative.  Negative for chest pain, palpitations and leg swelling.  Gastrointestinal: Negative.   Allergic/Immunologic: Negative.   Neurological: Negative.   Psychiatric/Behavioral: Negative.        Physical Exam  Pulse 76   Temp (!) 97.5 F (36.4 C)   SpO2 98% Comment: RA  Wt Readings from Last 5 Encounters:  02/02/20 252 lb 0.1 oz (114.3 kg)  01/18/20 253 lb 0.1 oz (114.8 kg)  01/04/20 250 lb 0.1 oz (113.4 kg)  12/15/19 257 lb 15 oz (117 kg)  12/15/19 258 lb (117 kg)     Physical Exam Vitals and nursing note reviewed.  Constitutional:      General: He is not in acute distress.    Appearance: He is well-developed and well-nourished.  Cardiovascular:     Rate and Rhythm: Normal rate and regular rhythm.  Pulmonary:     Effort: Pulmonary effort is normal.     Breath sounds: Normal  breath sounds.  Musculoskeletal:     Right lower leg: No edema.     Left lower leg: No edema.  Skin:    General: Skin is warm and dry.  Neurological:     Mental Status: He is alert and oriented to person, place, and time.  Psychiatric:        Mood and Affect: Mood and affect and mood normal.        Behavior: Behavior normal.       Imaging: DG Chest 2 View  Result Date: 03/02/2020 CLINICAL DATA:  Cough occasional shortness of breath for the past 2 weeks. History of COVID infection in September. EXAM: CHEST - 2 VIEW COMPARISON:  Chest x-ray dated January 31, 2020. FINDINGS: The heart size and mediastinal contours are within normal limits. Both lungs are clear. The visualized skeletal structures are unremarkable. IMPRESSION:  No active cardiopulmonary disease. Electronically Signed   By: Obie Dredge M.D.   On: 03/02/2020 14:06     Assessment & Plan:   Pneumonia due to COVID-19 virus Fatigue Shortness of breath:  Resolved.    Stay well hydrated  Stay active  Deep breathing exercises  May take tylenol for fever or pain  May use inhaler as needed      Follow up:  Follow up if needed       Ivonne Andrew, NP 03/05/2020

## 2022-01-29 IMAGING — CR DG CHEST 2V
2 series · 2 of 2 positions shown · non-contrast
Comparison: Prior chest radiographs 12/15/2019.

CLINICAL DATA: History of COVID nineteen. Additional history
provided: Cough, shortness of breath.

EXAM:
CHEST - 2 VIEW

[w chest pa]
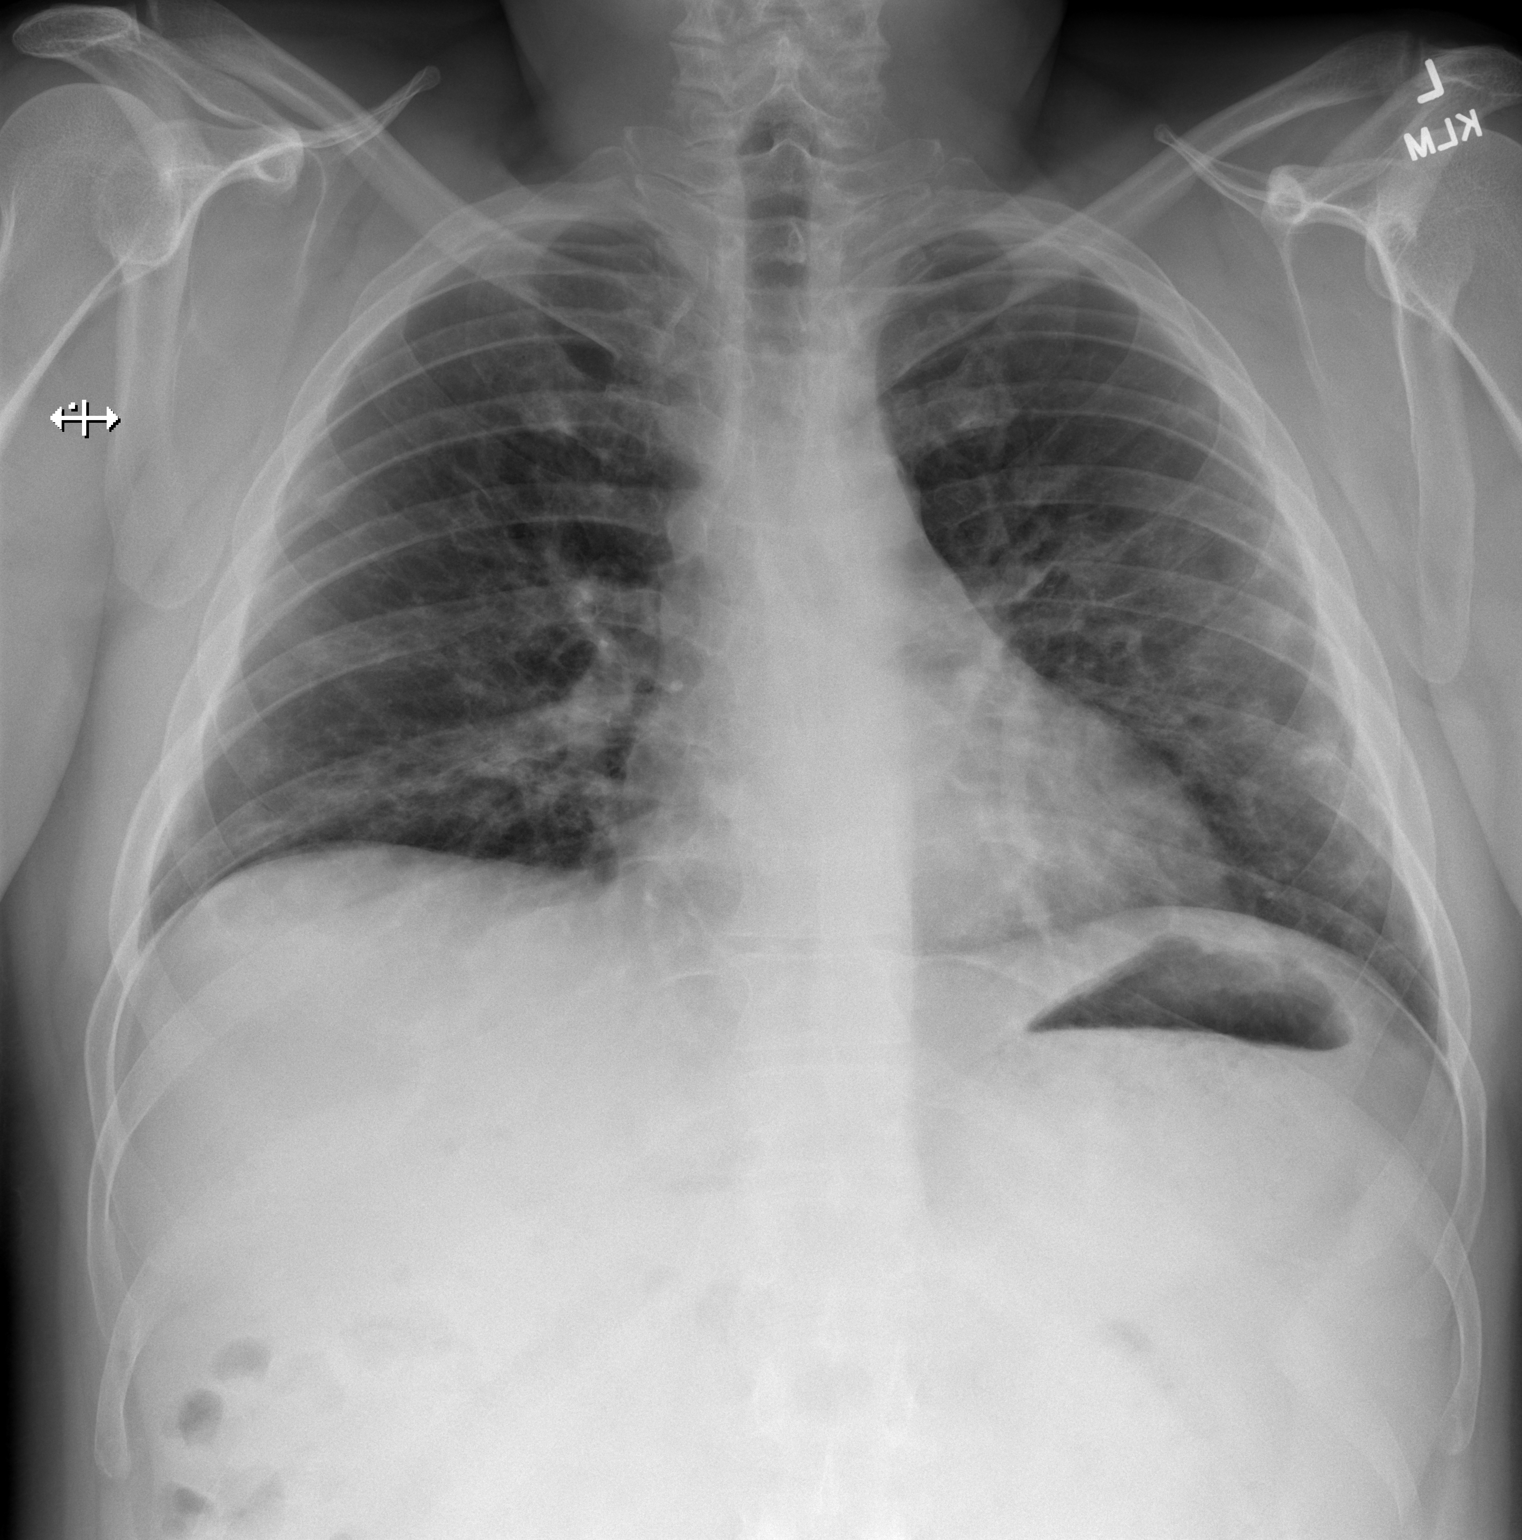

[w chest lat]
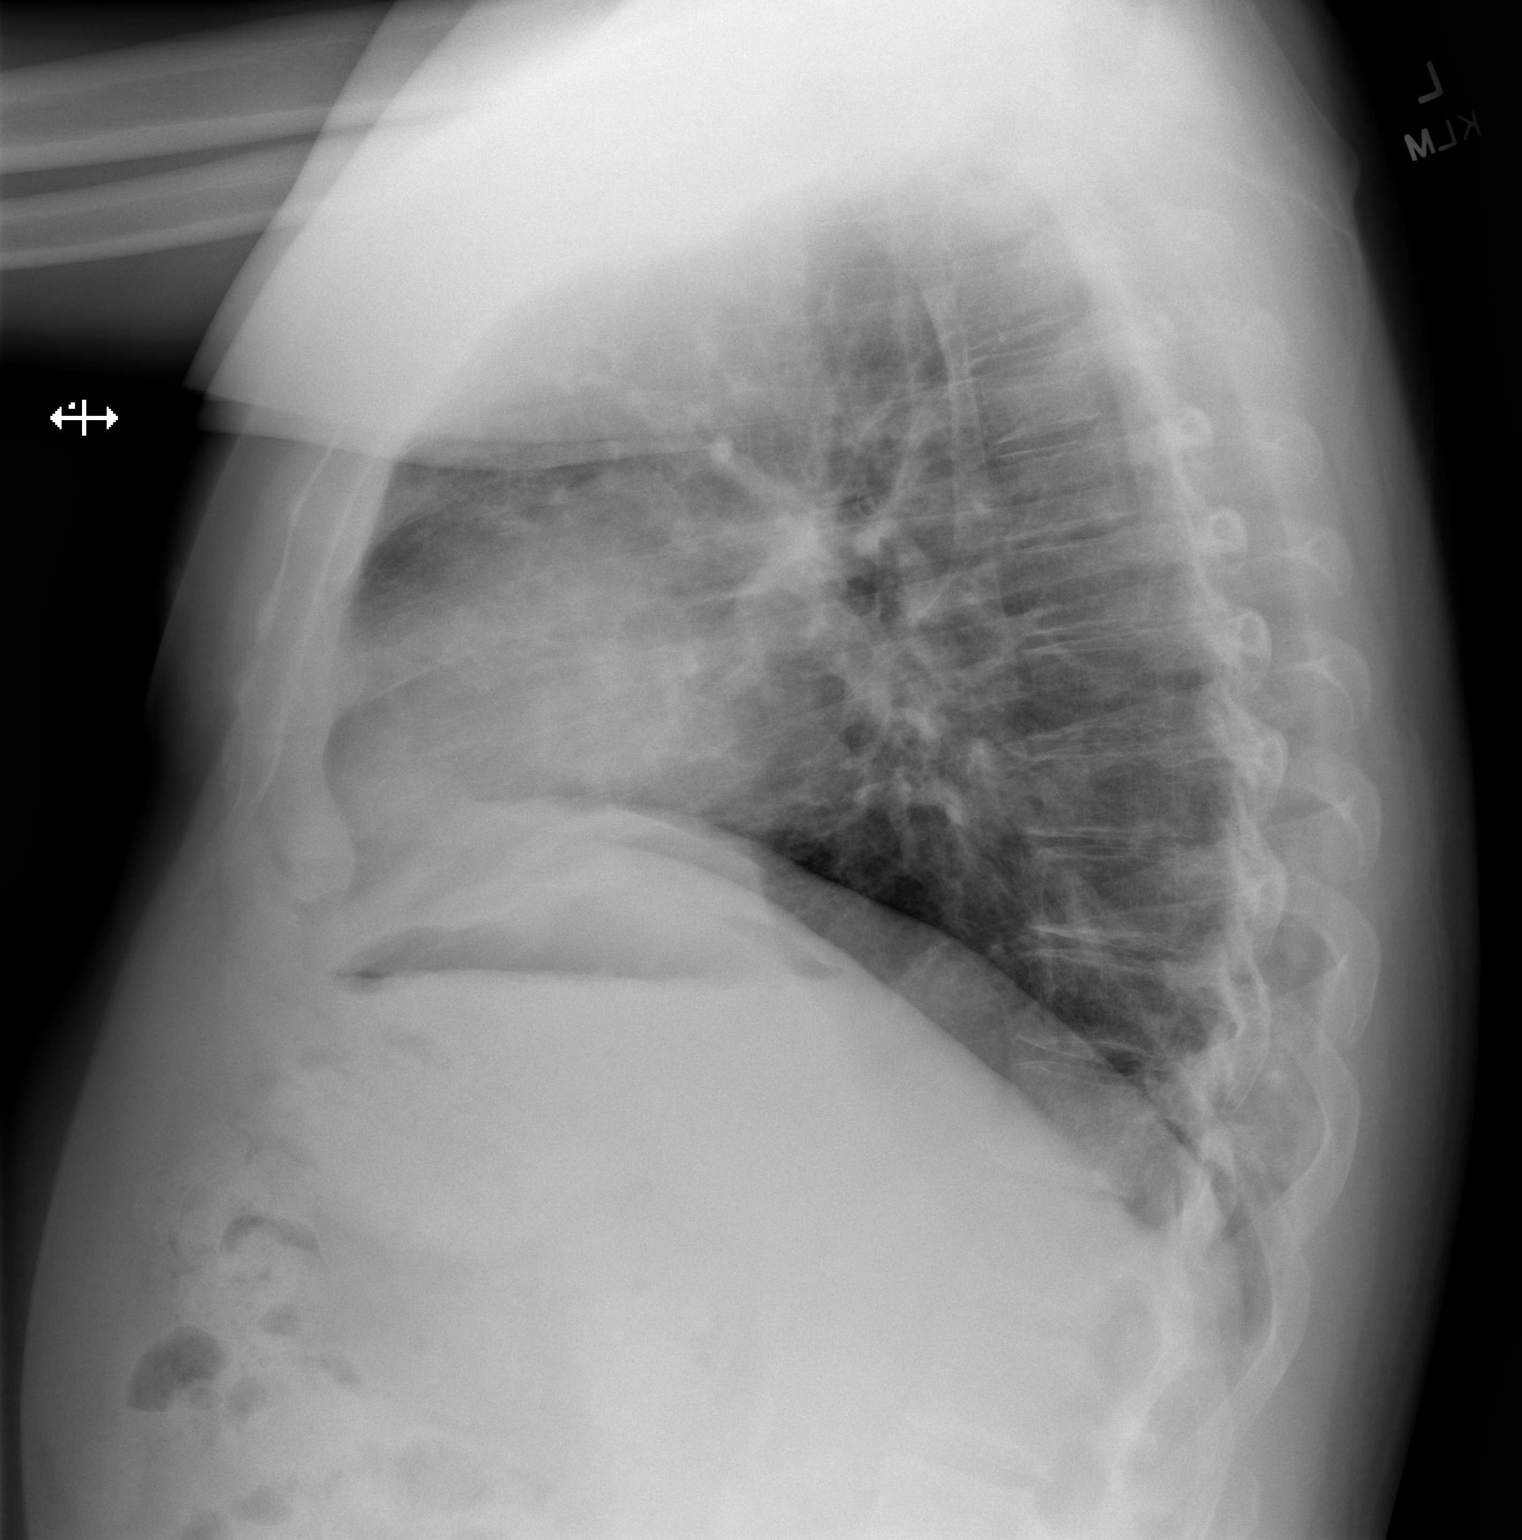

[2 of 2 positions shown; findings below may reference images not displayed]

FINDINGS: Heart size within normal limits. There are persistent patchy
bilateral pulmonary opacities with a peripheral and mid to lower
lung predominance. No evidence of pleural effusion or pneumothorax.
No acute bony abnormality identified.
IMPRESSION: Persistent patchy bilateral pulmonary opacities with a peripheral
and mid to lower lung predominance, overall not significantly
changed as compared to 12/15/2019. These findings likely reflect
atypical/viral pneumonia given the provided history of COVID
positivity. Radiographic follow-up to resolution is recommended.

## 2022-02-25 IMAGING — CR DG CHEST 2V
2 series · 2 of 2 positions shown · non-contrast
Comparison: Radiograph 01/04/2020, 12/15/2019

CLINICAL DATA: History of COVID. COVID positive 12/06/2019.
Persistent dyspnea on exertion.

EXAM:
CHEST - 2 VIEW

[w chest pa]
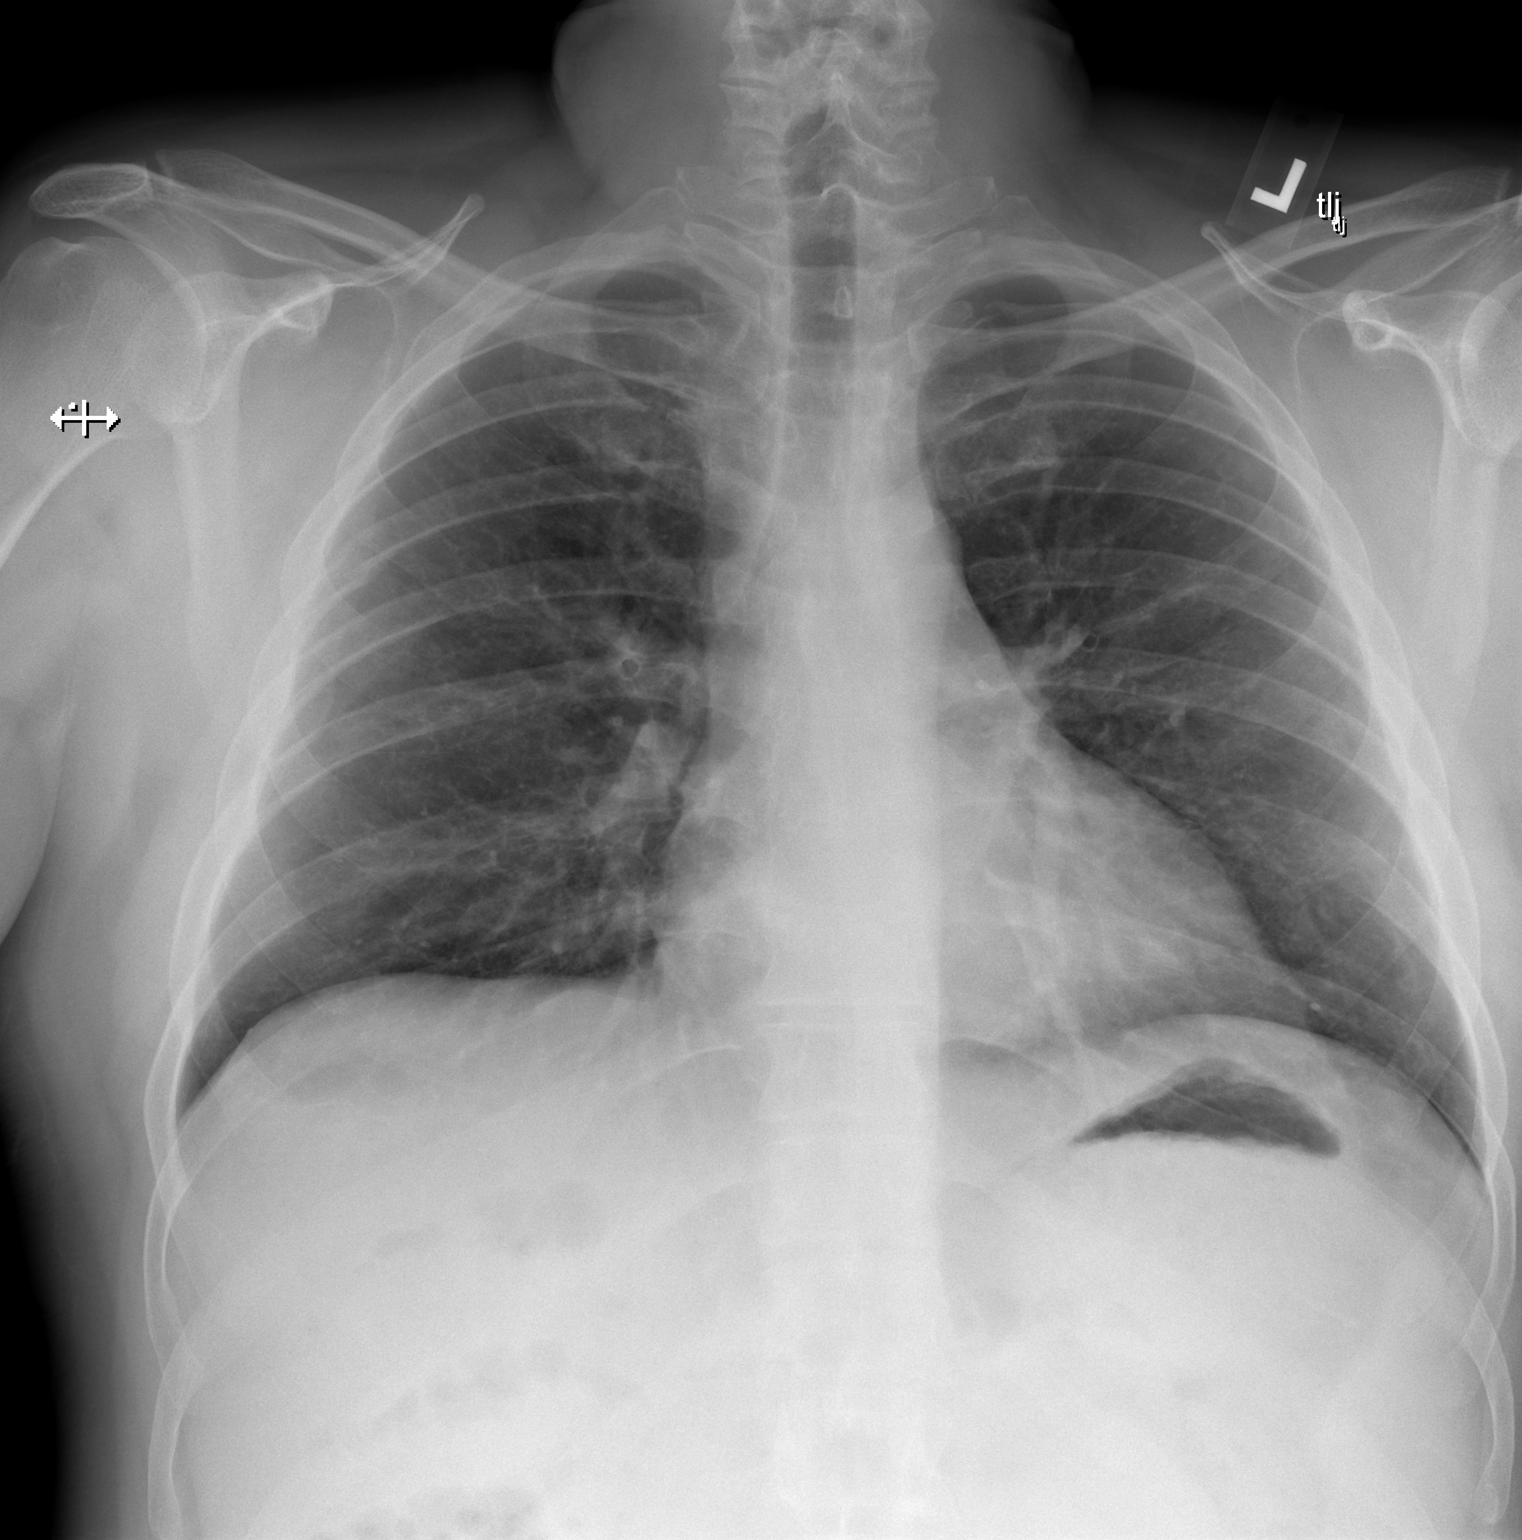

[w chest lat]
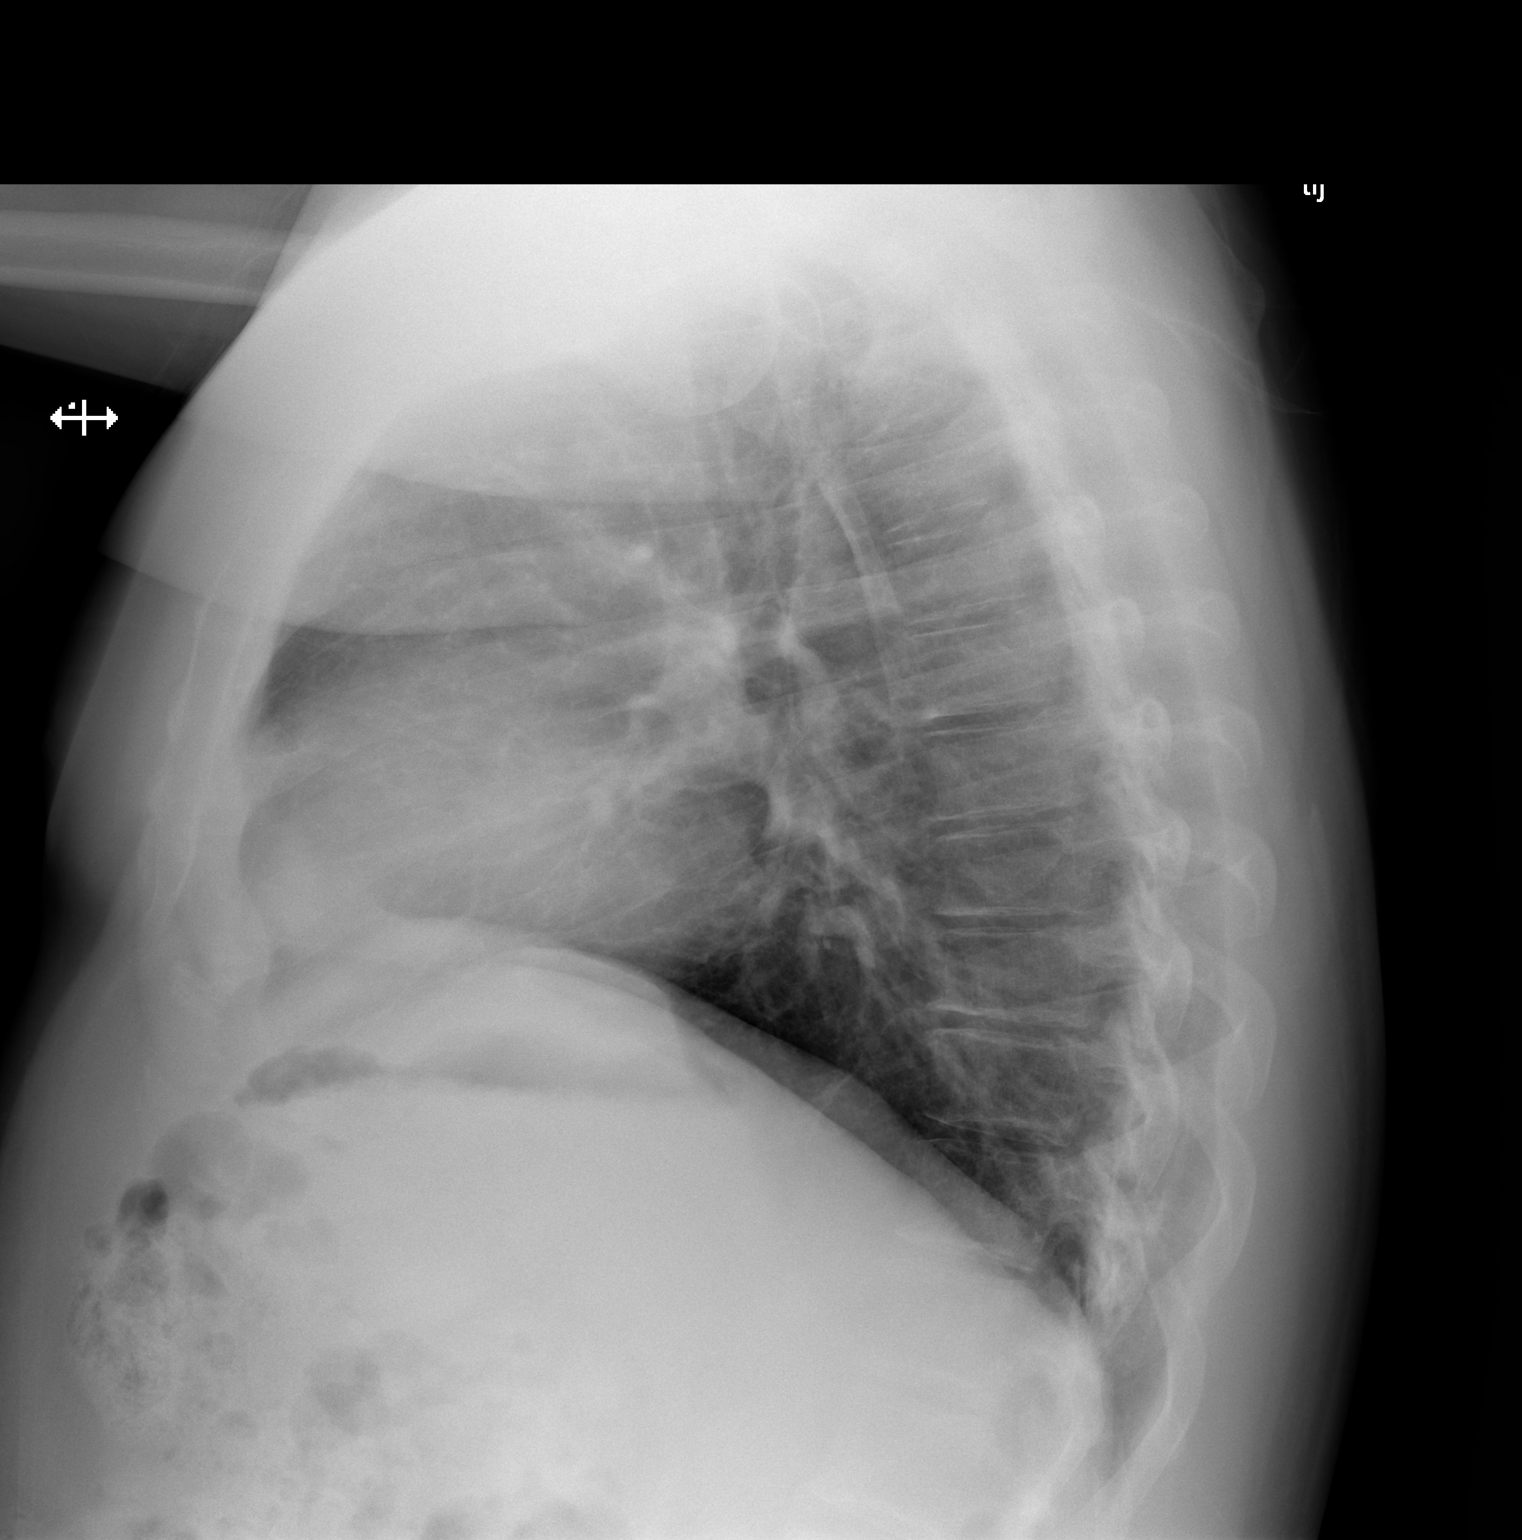

[2 of 2 positions shown; findings below may reference images not displayed]

FINDINGS: Improvement in heterogeneous bilateral lung opacities from prior
exam. There may be minimal residual subsegmental the lung bases. No
new airspace disease or consolidation. Normal heart size and
mediastinal contours. No pneumothorax or pneumomediastinum. No
pleural fluid. No acute osseous abnormalities are seen.
IMPRESSION: Improving bilateral lung opacities from prior exam, consistent with
resolving COVID pneumonia. Minimal residual subsegmental opacities
at the lung bases.

## 2023-05-08 ENCOUNTER — Emergency Department (HOSPITAL_BASED_OUTPATIENT_CLINIC_OR_DEPARTMENT_OTHER)
Admission: EM | Admit: 2023-05-08 | Discharge: 2023-05-09 | Disposition: A | Discharge status: Home / Self Care | Payer: Self-pay | Attending: Emergency Medicine | Admitting: Emergency Medicine

## 2023-05-08 ENCOUNTER — Other Ambulatory Visit: Payer: Self-pay

## 2023-05-08 ENCOUNTER — Encounter (HOSPITAL_BASED_OUTPATIENT_CLINIC_OR_DEPARTMENT_OTHER): Payer: Self-pay

## 2023-05-08 DIAGNOSIS — S61012A Laceration without foreign body of left thumb without damage to nail, initial encounter: Secondary | ICD-10-CM | POA: Insufficient documentation

## 2023-05-08 DIAGNOSIS — S6992XA Unspecified injury of left wrist, hand and finger(s), initial encounter: Secondary | ICD-10-CM | POA: Diagnosis present

## 2023-05-08 DIAGNOSIS — W268XXA Contact with other sharp object(s), not elsewhere classified, initial encounter: Secondary | ICD-10-CM | POA: Diagnosis not present

## 2023-05-08 DIAGNOSIS — Z7982 Long term (current) use of aspirin: Secondary | ICD-10-CM | POA: Diagnosis not present

## 2023-05-08 NOTE — ED Triage Notes (Addendum)
 Pt reports he is here today due to finger laceration. Pt reports he was trying to open a beer with sharp tool when he cut his left thumb. Pt reports it happen around 7pm but has not stopped bleeding. Wound care provided in triage. Pt wound is actively bleeding. Pressure dressing applied. Pt reports tetanus up to date.

## 2023-05-09 MED ORDER — ONDANSETRON 4 MG PO TBDP
4.0000 mg | ORAL_TABLET | Freq: Once | ORAL | Status: AC
  Administered 2023-05-09: 4 mg via ORAL
  Filled 2023-05-09: qty 1

## 2023-05-09 NOTE — ED Notes (Signed)
 RN reviewed discharge instructions with pt. Pt verbalized understanding and had no further questions

## 2023-05-09 NOTE — ED Provider Notes (Signed)
 [facility] Provider Note   CSN: [id number] Arrival date & time: 05/08/23  2033     History  Chief Complaint  Patient presents with   Finger Injury    Bruce Russell is a 43 y.o. male.  HPI     This is a 43 year old male who presents with an injury to the left thumb.  Patient reports that he was using a new tool to open up a beer when he slit his left thumb.  This occurred around 7:30 PM.  He has been unable to stop bleeding.  He is right-handed.  Tetanus shot is up-to-date.  Home Medications Prior to Admission medications   Medication Sig Start Date End Date Taking? Authorizing Provider  acetaminophen (TYLENOL) 500 MG tablet Take 1,000 mg by mouth every 6 (six) hours as needed for moderate pain.    [provider]  albuterol (VENTOLIN HFA) 108 (90 Base) MCG/ACT inhaler Inhale 2 puffs into the lungs every 6 (six) hours as needed for wheezing or shortness of breath. 12/20/19   Leatha Gilding, MD  Ascorbic Acid (VITAMIN C) 1000 MG tablet Take 1,000 mg by mouth daily.    [provider]  aspirin EC 81 MG tablet Take 81 mg by mouth daily. Swallow whole.    [provider]  cholecalciferol (VITAMIN D3) 25 MCG (1000 UNIT) tablet Take 1,000 Units by mouth daily.    [provider]  guaiFENesin-dextromethorphan (ROBITUSSIN DM) 100-10 MG/5ML syrup Take 10 mLs by mouth every 4 (four) hours as needed for cough. Patient not taking: Reported on 01/04/2020 12/20/19   Leatha Gilding, MD  montelukast (SINGULAIR) 10 MG tablet Take 1 tablet (10 mg total) by mouth at bedtime. 02/02/20   Ivonne Andrew, NP      Allergies    Other    Review of Systems   Review of Systems  Skin:  Positive for wound.  All other systems reviewed and are negative.   Physical Exam Updated Vital Signs BP (!) 129/91 (BP Location: Right Arm)   Pulse 79   Temp (!) 97.5 F (36.4 C) (Temporal)   Resp 16   SpO2 98%  Physical  Exam Vitals and nursing note reviewed.  Constitutional:      Appearance: He is well-developed. He is not ill-appearing.  HENT:     Head: Normocephalic and atraumatic.  Eyes:     Pupils: Pupils are equal, round, and reactive to light.  Cardiovascular:     Rate and Rhythm: Normal rate and regular rhythm.  Pulmonary:     Effort: Pulmonary effort is normal. No respiratory distress.  Abdominal:     Palpations: Abdomen is soft.  Musculoskeletal:     Cervical back: Neck supple.     Comments: Normal flexion and extension of the thumb  Lymphadenopathy:     Cervical: No cervical adenopathy.  Skin:    General: Skin is warm and dry.     Comments: 1.5 cm laceration over the distal aspect of the thumb just adjacent to the nailbed, superficial, nongaping, no active bleeding  Neurological:     Mental Status: He is alert and oriented to person, place, and time.  Psychiatric:        Mood and Affect: Mood normal.     ED Results / Procedures / Treatments   Labs (all labs ordered are listed, but only abnormal results are displayed) Labs Reviewed - No data to display  EKG None  Radiology No  results found.  Procedures Procedures    Medications Ordered in ED Medications  ondansetron (ZOFRAN-ODT) disintegrating tablet 4 mg (4 mg Oral Given 05/09/23 0013)    ED Course/ Medical Decision Making/ A&P                                 Medical Decision Making Risk Prescription drug management.   This patient presents to the ED for concern of thumb laceration, this involves an extensive number of treatment options, and is a complaint that carries with it a high risk of complications and morbidity.  I considered the following differential and admission for this acute, potentially life threatening condition.  The differential diagnosis includes laceration, deep injury including ligamentous injury  MDM:    This is a 43 year old male who presents with concern for thumb laceration.  He is  nontoxic and vital signs are reassuring.  Laceration without active bleeding.  Fairly superficial.  Patient does not want stitches.  We discussed wound care.  Wound was dressed at the bedside.  (Labs, imaging, consults)  Labs: I Ordered, and personally interpreted labs.  The pertinent results include: None  Imaging Studies ordered: I ordered imaging studies including none I independently visualized and interpreted imaging. I agree with the radiologist interpretation  Additional history obtained from chart review.  External records from outside source obtained and reviewed including prior evaluations  Cardiac Monitoring: The patient was not maintained on a cardiac monitor.  If on the cardiac monitor, I personally viewed and interpreted the cardiac monitored which showed an underlying rhythm of: N/A  Reevaluation: After the interventions noted above, I reevaluated the patient and found that they have :improved  Social Determinants of Health:  lives independently  Disposition: Discharge  Co morbidities that complicate the patient evaluation History reviewed. No pertinent past medical history.   Medicines Meds ordered this encounter  Medications   ondansetron (ZOFRAN-ODT) disintegrating tablet 4 mg    I have reviewed the patients home medicines and have made adjustments as needed  Problem List / ED Course: Problem List Items Addressed This Visit   None Visit Diagnoses       Laceration of left thumb without foreign body without damage to nail, initial encounter    -  Primary                   Final Clinical Impression(s) / ED Diagnoses Final diagnoses:  Laceration of left thumb without foreign body without damage to nail, initial encounter    Rx / DC Orders ED Discharge Orders     None         Wilkie Aye, Mayer Masker, MD 05/09/23 0040

## 2023-05-09 NOTE — Discharge Instructions (Signed)
 Keep finger cleaned and dressed.  Avoid submersion for the next 3 to 5 days.  Monitor for signs and symptoms of infection.
